# Patient Record
Sex: Male | Born: 1945 | Race: White | Hispanic: No | Marital: Married | Smoking: Former smoker
Health system: Southern US, Community
[De-identification: ages and names within clinical notes are randomized; demographics above are authoritative.]

## PROBLEM LIST (undated history)

## (undated) DIAGNOSIS — Z86711 Personal history of pulmonary embolism: Secondary | ICD-10-CM

## (undated) DIAGNOSIS — D61811 Other drug-induced pancytopenia: Secondary | ICD-10-CM

## (undated) DIAGNOSIS — R251 Tremor, unspecified: Secondary | ICD-10-CM

## (undated) DIAGNOSIS — D759 Disease of blood and blood-forming organs, unspecified: Secondary | ICD-10-CM

## (undated) DIAGNOSIS — F259 Schizoaffective disorder, unspecified: Secondary | ICD-10-CM

## (undated) DIAGNOSIS — D509 Iron deficiency anemia, unspecified: Secondary | ICD-10-CM

## (undated) DIAGNOSIS — F329 Major depressive disorder, single episode, unspecified: Secondary | ICD-10-CM

## (undated) DIAGNOSIS — Z8673 Personal history of transient ischemic attack (TIA), and cerebral infarction without residual deficits: Secondary | ICD-10-CM

## (undated) DIAGNOSIS — G20A1 Parkinson's disease without dyskinesia, without mention of fluctuations: Secondary | ICD-10-CM

## (undated) DIAGNOSIS — N401 Enlarged prostate with lower urinary tract symptoms: Secondary | ICD-10-CM

## (undated) DIAGNOSIS — I252 Old myocardial infarction: Secondary | ICD-10-CM

## (undated) DIAGNOSIS — K760 Fatty (change of) liver, not elsewhere classified: Secondary | ICD-10-CM

## (undated) DIAGNOSIS — K5090 Crohn's disease, unspecified, without complications: Secondary | ICD-10-CM

## (undated) DIAGNOSIS — N200 Calculus of kidney: Secondary | ICD-10-CM

## (undated) DIAGNOSIS — M1990 Unspecified osteoarthritis, unspecified site: Secondary | ICD-10-CM

## (undated) DIAGNOSIS — I2510 Atherosclerotic heart disease of native coronary artery without angina pectoris: Secondary | ICD-10-CM

## (undated) DIAGNOSIS — T50905A Adverse effect of unspecified drugs, medicaments and biological substances, initial encounter: Secondary | ICD-10-CM

## (undated) DIAGNOSIS — N138 Other obstructive and reflux uropathy: Secondary | ICD-10-CM

## (undated) DIAGNOSIS — L309 Dermatitis, unspecified: Secondary | ICD-10-CM

## (undated) DIAGNOSIS — D696 Thrombocytopenia, unspecified: Secondary | ICD-10-CM

## (undated) DIAGNOSIS — Z973 Presence of spectacles and contact lenses: Secondary | ICD-10-CM

## (undated) DIAGNOSIS — N183 Chronic kidney disease, stage 3 unspecified: Secondary | ICD-10-CM

## (undated) DIAGNOSIS — F32A Depression, unspecified: Secondary | ICD-10-CM

## (undated) DIAGNOSIS — F419 Anxiety disorder, unspecified: Secondary | ICD-10-CM

## (undated) DIAGNOSIS — G2581 Restless legs syndrome: Secondary | ICD-10-CM

## (undated) DIAGNOSIS — R339 Retention of urine, unspecified: Secondary | ICD-10-CM

## (undated) DIAGNOSIS — R269 Unspecified abnormalities of gait and mobility: Secondary | ICD-10-CM

## (undated) DIAGNOSIS — K219 Gastro-esophageal reflux disease without esophagitis: Secondary | ICD-10-CM

## (undated) DIAGNOSIS — Z932 Ileostomy status: Secondary | ICD-10-CM

## (undated) DIAGNOSIS — Z978 Presence of other specified devices: Secondary | ICD-10-CM

## (undated) DIAGNOSIS — G20 Parkinson's disease: Secondary | ICD-10-CM

## (undated) DIAGNOSIS — Z86718 Personal history of other venous thrombosis and embolism: Secondary | ICD-10-CM

## (undated) DIAGNOSIS — R7303 Prediabetes: Secondary | ICD-10-CM

## (undated) DIAGNOSIS — Z960 Presence of urogenital implants: Secondary | ICD-10-CM

## (undated) HISTORY — DX: Major depressive disorder, single episode, unspecified: F32.9

## (undated) HISTORY — PX: TONSILLECTOMY: SUR1361

## (undated) HISTORY — DX: Depression, unspecified: F32.A

## (undated) HISTORY — PX: COLECTOMY: SHX59

## (undated) HISTORY — PX: CARDIAC CATHETERIZATION: SHX172

## (undated) HISTORY — DX: Anxiety disorder, unspecified: F41.9

## (undated) HISTORY — PX: TRANSTHORACIC ECHOCARDIOGRAM: SHX275

## (undated) HISTORY — DX: Crohn's disease, unspecified, without complications: K50.90

---

## 1999-03-26 DIAGNOSIS — I252 Old myocardial infarction: Secondary | ICD-10-CM

## 1999-03-26 HISTORY — DX: Old myocardial infarction: I25.2

## 2001-03-25 HISTORY — PX: ABDOMINOPERINEAL PROCTOCOLECTOMY: SUR8

## 2012-12-04 ENCOUNTER — Emergency Department (HOSPITAL_COMMUNITY)
Admission: EM | Admit: 2012-12-04 | Discharge: 2012-12-04 | Disposition: A | Discharge status: Home / Self Care | Payer: Medicare Other | Attending: Emergency Medicine | Admitting: Emergency Medicine

## 2012-12-04 ENCOUNTER — Encounter (HOSPITAL_COMMUNITY): Payer: Self-pay | Admitting: Emergency Medicine

## 2012-12-04 DIAGNOSIS — L259 Unspecified contact dermatitis, unspecified cause: Secondary | ICD-10-CM | POA: Insufficient documentation

## 2012-12-04 DIAGNOSIS — L309 Dermatitis, unspecified: Secondary | ICD-10-CM

## 2012-12-04 DIAGNOSIS — R21 Rash and other nonspecific skin eruption: Secondary | ICD-10-CM | POA: Insufficient documentation

## 2012-12-04 DIAGNOSIS — Z8719 Personal history of other diseases of the digestive system: Secondary | ICD-10-CM | POA: Insufficient documentation

## 2012-12-04 DIAGNOSIS — Z933 Colostomy status: Secondary | ICD-10-CM | POA: Insufficient documentation

## 2012-12-04 DIAGNOSIS — Z9889 Other specified postprocedural states: Secondary | ICD-10-CM | POA: Insufficient documentation

## 2012-12-04 DIAGNOSIS — Z79899 Other long term (current) drug therapy: Secondary | ICD-10-CM | POA: Insufficient documentation

## 2012-12-04 DIAGNOSIS — R197 Diarrhea, unspecified: Secondary | ICD-10-CM | POA: Insufficient documentation

## 2012-12-04 MED ORDER — METRONIDAZOLE 500 MG PO TABS: 500.0000 mg | ORAL_TABLET | Freq: Two times a day (BID) | ORAL | Status: DC

## 2012-12-04 MED ORDER — DIPHENOXYLATE-ATROPINE 2.5-0.025 MG PO TABS: 1.0000 | ORAL_TABLET | Freq: Four times a day (QID) | ORAL | Status: DC | PRN

## 2012-12-04 MED ORDER — MENTHOL-ZINC OXIDE 0.44-20.625 % EX OINT: 1.0000 "application " | TOPICAL_OINTMENT | Freq: Four times a day (QID) | CUTANEOUS | Status: DC

## 2012-12-04 NOTE — ED Notes (Signed)
Pt sees DR. Eusebio Friendly for GI management and asked that stool culture results be sent to him if they come back abnormal. His number is [phone]

## 2012-12-04 NOTE — ED Provider Notes (Signed)
CSN: [id number]     Arrival date & time 12/04/12  1630 History   First MD Initiated Contact with Patient 12/04/12 1809     Chief Complaint  Patient presents with  . Diarrhea    colostomy  . 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 Done for Crohn's colitis. He is a short segment of bowel and colostomy. This is not an ileostomy. He had a slight increase in the amount of stool output and a slight decrease in the consistency of the stool. He developed an area of irritated skin adjacent his ostomy. He's had difficulty getting the flu with his adhesive for his ostomy. He has had recurrent leaks. His skin is irritated. He became frustrated at home. He presents here. He's had no blood in the stools presenting abdominal pain or cramping History reviewed. No pertinent past medical history. Past Surgical History  Procedure Laterality Date  . Abdominal surgery    . Ostomy     History reviewed. No pertinent family history. History  Substance Use Topics  . Smoking status: Never Smoker   . Smokeless tobacco: Not on file  . Alcohol Use: No    Review of Systems  Constitutional: Negative for fever.  Respiratory: Negative for shortness of breath.   Cardiovascular: Negative for chest pain.  Gastrointestinal: Positive for diarrhea. Negative for abdominal pain.  Genitourinary: Negative for urgency and decreased urine volume.  Skin: Positive for rash.  Neurological: Negative for weakness.  Hematological: Does not bruise/bleed easily.    Allergies  Aspirin and Celecoxib  Home Medications   Current Outpatient Rx  Name  Route  Sig  Dispense  Refill  . adalimumab (HUMIRA PEN) 40 MG/0.8ML injection   Subcutaneous   Inject 40 mg into the skin every 14 (fourteen) days. Due Monday     12/07/12         . calcium carbonate (OS-CAL) 600 MG TABS tablet   Oral   Take 600 mg by mouth 2 (two) times daily with a meal.         . Multiple Vitamin  (MULTIVITAMIN WITH MINERALS) TABS tablet   Oral   Take 1 tablet by mouth daily.         . diphenoxylate-atropine (LOMOTIL) 2.5-0.025 MG per tablet   Oral   Take 1 tablet by mouth 4 (four) times daily as needed for diarrhea or loose stools.   30 tablet   0   . Menthol-Zinc Oxide (CALMOSEPTINE) 0.44-20.625 % OINT   Apply externally   Apply 1 application topically QID.   120 g   2   . metroNIDAZOLE (FLAGYL) 500 MG tablet   Oral   Take 1 tablet (500 mg total) by mouth 2 (two) times daily.   20 tablet   0    BP 126/78  Pulse 82  Temp(Src) 98.5 F (36.9 C) (Oral)  Resp 20  SpO2 96% Physical Exam  Constitutional: He is oriented to person, place, and time. He appears well-developed and well-nourished. No distress.  HENT:  Head: Normocephalic.  Eyes: Conjunctivae are normal. Pupils are equal, round, and reactive to light. No scleral icterus.  Neck: Normal range of motion. Neck supple. No thyromegaly present.  Cardiovascular: Normal rate and regular rhythm.  Exam reveals no gallop and no friction rub.   No murmur heard. Pulmonary/Chest: Effort normal and breath sounds normal. No respiratory distress. He has no wheezes.  He has no rales.  Abdominal: Soft. Bowel sounds are normal. He exhibits no distension. There is no tenderness. There is no rebound.  Musculoskeletal: Normal range of motion.  Neurological: He is alert and oriented to person, place, and time.  Skin: Skin is warm and dry. Rash noted.  The skin adjacent his ostomy site is somewhat erythematous. There is some breakdown of skin and some friable skin that bleeds easily.  Psychiatric: He has a normal mood and affect. His behavior is normal.    ED Course  Procedures (including critical care time) Labs Review Labs Reviewed  OCCULT BLOOD, POC DEVICE - Abnormal; Notable for the following:    Fecal Occult Bld POSITIVE (*)    All other components within normal limits  STOOL CULTURE  CLOSTRIDIUM DIFFICILE BY PCR   OCCULT BLOOD X 1 CARD TO LAB, STOOL   Imaging Review No results found.  MDM   1. Dermatitis   2. Diarrhea    Evaluation of symptoms after 6:00 at night on Friday. We do not have an ostomy nurse available. I had him brought his eyes or his ostomy. We've discussed having the ring larger. Will use them, septic joint and adjacent the ostomy. He has an extra wide he said that he can place over the initial area of a single Fleet c overuse of breakthrough leakage. Obtained a stool for culture, enteric pathogens, and C. difficile. Place him on empiric Flagyl. Lomotil to hopefully decrease volume., Ceftin ointment. See your doctor Monday for further instructions.    Lolita Patella, MD 12/04/12 1940

## 2012-12-04 NOTE — ED Notes (Signed)
Pt states that he is having constant diarrhea through his colostomy and having to change his bags constantly due to leakage instead of every 3-4 days like normal.  Pt states that the skin under his stoma is very raw and irritated and is why the pt wants to be evaluated today.

## 2012-12-08 LAB — STOOL CULTURE

## 2014-09-28 ENCOUNTER — Encounter: Payer: Self-pay | Admitting: Neurology

## 2014-09-28 ENCOUNTER — Ambulatory Visit (INDEPENDENT_AMBULATORY_CARE_PROVIDER_SITE_OTHER): Payer: Medicare Other | Admitting: Neurology

## 2014-09-28 VITALS — BP 138/82 | HR 99 | Temp 98.4°F | Ht 66.0 in | Wt 214.4 lb

## 2014-09-28 DIAGNOSIS — R270 Ataxia, unspecified: Secondary | ICD-10-CM | POA: Diagnosis not present

## 2014-09-28 DIAGNOSIS — G458 Other transient cerebral ischemic attacks and related syndromes: Secondary | ICD-10-CM | POA: Diagnosis not present

## 2014-09-28 DIAGNOSIS — G0481 Other encephalitis and encephalomyelitis: Secondary | ICD-10-CM | POA: Diagnosis not present

## 2014-09-28 DIAGNOSIS — F05 Delirium due to known physiological condition: Secondary | ICD-10-CM | POA: Diagnosis not present

## 2014-09-28 DIAGNOSIS — R251 Tremor, unspecified: Secondary | ICD-10-CM | POA: Diagnosis not present

## 2014-09-28 DIAGNOSIS — G459 Transient cerebral ischemic attack, unspecified: Secondary | ICD-10-CM | POA: Insufficient documentation

## 2014-09-28 DIAGNOSIS — R413 Other amnesia: Secondary | ICD-10-CM

## 2014-09-28 DIAGNOSIS — E538 Deficiency of other specified B group vitamins: Secondary | ICD-10-CM

## 2014-09-28 DIAGNOSIS — R410 Disorientation, unspecified: Secondary | ICD-10-CM | POA: Insufficient documentation

## 2014-09-28 NOTE — Progress Notes (Addendum)
[facility]    Provider:  Dr Jaynee Eagles Referring Provider: Katherina Mires, MD Primary Care Physician:  Suzanna Obey, MD  CC:  tremor  HPI:  Luke Bennett is a 69 y.o. male here as a referral from Dr. Doreene Nest for tremor. He has a PMHx of Schizoaffective disorder on Seroquel, depression, anxiety, TIA, MI, Crohn's Dz, thrombocytopenia, CKD, glucose intolerance. He is here with his wife who provides information.   He has a worsening tremor, started a few months ago. Also noticing that his right shoulder is lower than the other and holds his hand on his stomach when he walks possibly to try and keep it from trembling. He is having a harder time remembering things. Memory is less than they used to be. Walking is slower but steady.  He gets tired easily. No falls. He has had several episdes at night with feelings like he was going to collapse. He is changing a lot, declining. He will forget things in 15 minutes, wife will tell him something and he forgets, has to be reminded. He forgets things he does every day, like setting the table - forgets things that go on the table. He is getitng easily confused by tasks. Wife pays the bills. He does not drive. He is on Humira for Crohn's disease. They notice the tremor all the time. Mostly with resting or walking, sitting or eating, less with use. Doesn't happen when sleeping. Smell is fine. No constipation. No problems swallowing. Handwriting is getting poorer, shaky and smaller. No other focal neurologic symptoms. Denies hypophonia, shuffling. No other focal neurologic deficits.   Reviewed notes, labs and imaging from outside physicians, which showed: Notes from Dr. Suzanna Obey also document that patient's tremor has been worsening, right hand only, at rest. Does not affect ADLs. No gait instability. She was also concern for idiopathic Parkinson's disease versus TIA or stroke. He is on Humira for Crohn's disease. She also noted right greater than  left cogwheeling on exam.  Review of Systems: Patient complains of symptoms per HPI as well as the following symptoms: Fatigue, cough, snoring, skin sensitivity, impotence, memory loss, confusion, weakness, insomnia, snoring, depression, anxiety, too much sleep, decreased energy. Pertinent negatives per HPI. All others negative.   History   Social History  . Marital Status: Married    Spouse Name: N/A  . Number of Children: N/A  . Years of Education: N/A   Occupational History  . Retired    Social History Main Topics  . Smoking status: Former Research scientist (life sciences)  . Smokeless tobacco: Not on file  . Alcohol Use: No  . Drug Use: No  . Sexual Activity: Not on file   Other Topics Concern  . Not on file   Social History Narrative   Lives at home with wife and in-laws.   Caffeine use: none       Family History  Problem Relation Age of Onset  . Hypertension Sister   . Hodgkin's lymphoma Sister   . Cerebral aneurysm Father     Past Medical History  Diagnosis Date  . Depression   . Anxiety   . Heart attack   . TIA (transient ischemic attack)   . Crohn's disease   . Fatty liver     Past Surgical History  Procedure Laterality Date  . Abdominal surgery    . Ostomy    . Tonsillectomy    . Colectomy    . Stoma dilatation      Current Outpatient Prescriptions  Medication Sig Dispense Refill  . adalimumab (HUMIRA PEN) 40 MG/0.8ML injection Inject 40 mg into the skin every 14 (fourteen) days. Due Monday     12/07/12    . calcium carbonate (OS-CAL) 600 MG TABS tablet Take 600 mg by mouth 2 (two) times daily with a meal.    . Menthol-Zinc Oxide (CALMOSEPTINE) 0.44-20.625 % OINT Apply 1 application topically QID. 120 g 2  . Multiple Vitamin (MULTIVITAMIN WITH MINERALS) TABS tablet Take 1 tablet by mouth daily.    . QUEtiapine (SEROQUEL) 300 MG tablet Take 300 mg by mouth daily.  4   No current facility-administered medications for this visit.    Allergies as of 09/28/2014 - Review  Complete 09/28/2014  Allergen Reaction Noted  . Aspirin  12/04/2012  . Celecoxib  12/04/2012    Vitals: BP 138/82 mmHg  Pulse 99  Temp(Src) 98.4 F (36.9 C) (Oral)  Ht 5' 6"  (1.676 m)  Wt 214 lb 6.4 oz (97.251 kg)  BMI 34.62 kg/m2 Last Weight:  Wt Readings from Last 1 Encounters:  09/28/14 214 lb 6.4 oz (97.251 kg)   Last Height:   Ht Readings from Last 1 Encounters:  09/28/14 5' 6"  (1.676 m)    Physical exam: Exam: Gen: NAD, conversant, well nourised, obese, well groomed                     CV: RRR, no MRG. No Carotid Bruits. No peripheral edema, warm, nontender Eyes: Conjunctivae clear without exudates or hemorrhage  Neuro: Detailed Neurologic Exam  Speech:    Speech is normal; fluent and spontaneous with normal comprehension.  Cognition:    The patient is oriented to person, place, and time;     recent and remote memory appear impaired;     language fluent;     Impaired attention, concentration,     fund of knowledge impaired Cranial Nerves: masked facies    The pupils are equal, round, and reactive to light. The fundi are flat.  Visual fields are full to finger confrontation. Impaired upgaze, otherwise extraocular movements are intact. Trigeminal sensation is intact and the muscles of mastication are normal. The face is symmetric. The palate elevates in the midline. Hearing intact. Voice is normal. Shoulder shrug is normal. The tongue has normal motion without fasciculations.   Coordination:    Right arm bradykinesias  Gait:    Decreased arm right swing and re-emergent right hand tremor.   Motor Observation:    Mild right resting tremor with some postural components Tone:   Cogwheeling upper right extremity with facilitation  Posture:    Slightly stooped.    Strength:    Strength is V/V in the upper and lower limbs.      Sensation: intact to LT     Reflex Exam:  DTR's:    Deep tendon reflexes in the upper and lower extremities are symmetrical  bilaterally.   Toes:    The toes are downgoing bilaterally.   Clonus:    Clonus is absent.   Assessment/Plan:   69 y.o. male here as a referral from Dr. Doreene Nest for tremor. He has a PMHx of Schizoaffective disorder on Seroquel, depression, anxiety, TIA, MI, Crohn's Dz, thrombocytopenia, CKD, glucose intolerance who is here for eval of tremor and memory changes. Neuro exam significant for resting tremor with postural components, right arm cogwheeling with facilitation, decreased right arm swing on walking with  emergent tremor, decreased blink reflex, right hand bradykinesia. Consistent with parkinsonism. Patient is  on dopamine blocking agents, unclear if this is secondary to medication effect or if he is developing idiopathic PD, other neurodegenerative disorder. Could also be caused by cerebrovascular disease, brain lesions and other causes. We'll order an MRI of the brain and a serum workup.   Addendum: MRI of the brain normal for age .  Sarina Ill, MD  [facility] [address] [place], [place] [address]  Phone [phone] Fax [phone] .

## 2014-09-28 NOTE — Patient Instructions (Signed)
Overall you are doing fairly well but I do want to suggest a few things today:   Remember to drink plenty of fluid, eat healthy meals and do not skip any meals. Try to eat protein with a every meal and eat a healthy snack such as fruit or nuts in between meals. Try to keep a regular sleep-wake schedule and try to exercise daily, particularly in the form of walking, 20-30 minutes a day, if you can.   As far as your medications are concerned, I would like to suggest; hold off on medications for now until workup complete  As far as diagnostic testing: MRi of the brain, labs  I would like to see you back in 3 months, sooner if we need to. Please call us with any interim questions, concerns, problems, updates or refill requests.   Please also call us for any test results so we can go over those with you on the phone.  My clinical assistant and will answer any of your questions and relay your messages to me and also relay most of my messages to you.   Our phone number is [phone]. We also have an after hours call service for urgent matters and there is a physician on-call for urgent questions. For any emergencies you know to call 911 or go to the nearest emergency room

## 2014-10-02 LAB — THYROID PANEL WITH TSH
Free Thyroxine Index: 1.3 (ref 1.2–4.9)
T3 Uptake Ratio: 27 % (ref 24–39)
T4 TOTAL: 4.9 ug/dL (ref 4.5–12.0)
TSH: 1.24 u[IU]/mL (ref 0.450–4.500)

## 2014-10-02 LAB — COMPREHENSIVE METABOLIC PANEL
ALBUMIN: 4 g/dL (ref 3.6–4.8)
ALK PHOS: 60 IU/L (ref 39–117)
ALT: 35 IU/L (ref 0–44)
AST: 38 IU/L (ref 0–40)
Albumin/Globulin Ratio: 1.2 (ref 1.1–2.5)
BILIRUBIN TOTAL: 0.9 mg/dL (ref 0.0–1.2)
BUN / CREAT RATIO: 12 (ref 10–22)
BUN: 16 mg/dL (ref 8–27)
CHLORIDE: 99 mmol/L (ref 97–108)
CO2: 22 mmol/L (ref 18–29)
Calcium: 9.6 mg/dL (ref 8.6–10.2)
Creatinine, Ser: 1.36 mg/dL — ABNORMAL HIGH (ref 0.76–1.27)
GFR calc non Af Amer: 53 mL/min/{1.73_m2} — ABNORMAL LOW (ref >59)
GFR, EST AFRICAN AMERICAN: 61 mL/min/{1.73_m2} (ref >59)
GLUCOSE: 97 mg/dL (ref 65–99)
Globulin, Total: 3.3 g/dL (ref 1.5–4.5)
POTASSIUM: 4.7 mmol/L (ref 3.5–5.2)
Sodium: 135 mmol/L (ref 134–144)
Total Protein: 7.3 g/dL (ref 6.0–8.5)

## 2014-10-02 LAB — METHYLMALONIC ACID, SERUM: METHYLMALONIC ACID: 379 nmol/L — AB (ref 0–378)

## 2014-10-02 LAB — B12 AND FOLATE PANEL
Folate: >20 ng/mL (ref >3.0)
VITAMIN B 12: 546 pg/mL (ref 211–946)

## 2014-10-03 ENCOUNTER — Telehealth: Payer: Self-pay | Admitting: *Deleted

## 2014-10-03 NOTE — Telephone Encounter (Signed)
Spoke with pt about normal B12 and TSH. Told him his creatinine was elevated at 1.36 (normal: .76-1.27) and he needed to f/u with his PCP about this. He also asked what his potassium was. I informed him it was 4.7. Told him to call back with further questions and we are open until 5pm. Pt verbalized understanding.

## 2014-10-06 ENCOUNTER — Ambulatory Visit
Admission: RE | Admit: 2014-10-06 | Discharge: 2014-10-06 | Disposition: A | Discharge status: Home / Self Care | Payer: Medicare Other | Source: Ambulatory Visit | Attending: Neurology | Admitting: Neurology

## 2014-10-06 DIAGNOSIS — R413 Other amnesia: Secondary | ICD-10-CM | POA: Diagnosis not present

## 2014-10-06 DIAGNOSIS — R410 Disorientation, unspecified: Secondary | ICD-10-CM

## 2014-10-06 DIAGNOSIS — E538 Deficiency of other specified B group vitamins: Secondary | ICD-10-CM

## 2014-10-06 DIAGNOSIS — R251 Tremor, unspecified: Secondary | ICD-10-CM | POA: Diagnosis not present

## 2014-10-06 DIAGNOSIS — G0481 Other encephalitis and encephalomyelitis: Secondary | ICD-10-CM

## 2014-10-06 DIAGNOSIS — R270 Ataxia, unspecified: Secondary | ICD-10-CM | POA: Diagnosis not present

## 2014-10-06 DIAGNOSIS — G458 Other transient cerebral ischemic attacks and related syndromes: Secondary | ICD-10-CM

## 2014-10-06 DIAGNOSIS — F05 Delirium due to known physiological condition: Secondary | ICD-10-CM

## 2014-10-06 MED ORDER — GADOBENATE DIMEGLUMINE 529 MG/ML IV SOLN
20.0000 mL | Freq: Once | INTRAVENOUS | Status: AC | PRN
  Administered 2014-10-06: 20 mL via INTRAVENOUS

## 2014-10-11 ENCOUNTER — Telehealth: Payer: Self-pay | Admitting: Neurology

## 2014-10-11 NOTE — Telephone Encounter (Signed)
Spoke to patient. MRI of the brain normal for age.

## 2014-10-12 ENCOUNTER — Encounter: Payer: Self-pay | Admitting: *Deleted

## 2014-12-29 ENCOUNTER — Ambulatory Visit (INDEPENDENT_AMBULATORY_CARE_PROVIDER_SITE_OTHER): Payer: Medicare Other | Admitting: Neurology

## 2014-12-29 ENCOUNTER — Encounter: Payer: Self-pay | Admitting: Neurology

## 2014-12-29 VITALS — BP 136/82 | HR 85 | Ht 66.0 in | Wt 213.2 lb

## 2014-12-29 DIAGNOSIS — G20 Parkinson's disease: Secondary | ICD-10-CM | POA: Diagnosis not present

## 2014-12-29 MED ORDER — CARBIDOPA-LEVODOPA 25-100 MG PO TABS: 0.5000 | ORAL_TABLET | Freq: Three times a day (TID) | ORAL | Status: DC

## 2014-12-29 NOTE — Patient Instructions (Signed)
Overall you are doing fairly well but I do want to suggest a few things today:   Remember to drink plenty of fluid, eat healthy meals and do not skip any meals. Try to eat protein with a every meal and eat a healthy snack such as fruit or nuts in between meals. Try to keep a regular sleep-wake schedule and try to exercise daily, particularly in the form of walking, 20-30 minutes a day, if you can.   As far as your medications are concerned, I would like to suggest: Carbidopa/Levodopa 1/2 pill 3x a day, 4 hours apart, for example 8am, noon and 4pm. Try to separate Sinemet from food (especially protein-rich foods like meat, dairy, eggs) by about 30-60 mins - this will help the absorption of the medication. If you have some nausea with the medication, you can take it with some light food like crackers or ginger ale  I would like to see you back in 3 months, sooner if we need to. Please call us with any interim questions, concerns, problems, updates or refill requests.   Our phone number is [phone]. We also have an after hours call service for urgent matters and there is a physician on-call for urgent questions. For any emergencies you know to call 911 or go to the nearest emergency room

## 2014-12-29 NOTE — Progress Notes (Signed)
[facility]    Provider: Dr Jaynee Eagles Referring Provider: Katherina Mires, MD Primary Care Physician: Suzanna Obey, MD  CC: parkinsonism  HPI: Luke Bennett is a 69 y.o. male here as a referral from Dr. Doreene Nest for tremor. He has a PMHx of Schizoaffective disorder on Seroquel, depression, anxiety, TIA, MI, Crohn's Dz, thrombocytopenia, CKD, glucose intolerance. He is here with his wife who provides information. He acts out his dreams, she almost got clobbered the other night, hit her pillow. His resting tremor continues, feels it is worsening. Discussed MRI of the brain which was normal. Discussed his previous history of dopamine-blocking agents and his parkinsonism. Can try Sinemet and see how he responds, warned that it can cause confusion, hallucinations which is worrisome given patient's past medical history. He is on Seroquel currently. I have asked him to follow with psychiatry as well.   Previous history: He has a worsening tremor, started a few months ago. Also noticing that his right shoulder is lower than the other and holds his hand on his stomach when he walks possibly to try and keep it from trembling. He is having a harder time remembering things. Memory is less than they used to be. Walking is slower but steady. He gets tired easily. No falls. He has had several episdes at night with feelings like he was going to collapse. He is changing a lot, declining. He will forget things in 15 minutes, wife will tell him something and he forgets, has to be reminded. He forgets things he does every day, like setting the table - forgets things that go on the table. He is getitng easily confused by tasks. Wife pays the bills. He does not drive. He is on Humira for Crohn's disease. They notice the tremor all the time. Mostly with resting or walking, sitting or eating, less with use. Doesn't happen when sleeping. Smell is fine. No constipation. No problems swallowing. Handwriting is  getting poorer, shaky and smaller. No other focal neurologic symptoms. Denies hypophonia, shuffling. No other focal neurologic deficits.   Reviewed notes, labs and imaging from outside physicians, which showed: Notes from Dr. Suzanna Obey also document that patient's tremor has been worsening, right hand only, at rest. Does not affect ADLs. No gait instability. She was also concern for idiopathic Parkinson's disease versus TIA or stroke. He is on Humira for Crohn's disease. She also noted right greater than left cogwheeling on exam.  Review of Systems: Patient complains of symptoms per HPI as well as the following symptoms: tremor. No CP, no SOB. Pertinent negatives per HPI. All others negative.   Social History   Social History  . Marital Status: Married    Spouse Name: N/A  . Number of Children: N/A  . Years of Education: N/A   Occupational History  . Retired    Social History Main Topics  . Smoking status: Former Research scientist (life sciences)  . Smokeless tobacco: Not on file  . Alcohol Use: No  . Drug Use: No  . Sexual Activity: Not on file   Other Topics Concern  . Not on file   Social History Narrative   Lives at home with wife and in-laws.   Caffeine use: none       Family History  Problem Relation Age of Onset  . Hypertension Sister   . Hodgkin's lymphoma Sister   . Cerebral aneurysm Father   . Parkinsonism Neg Hx     Past Medical History  Diagnosis Date  . Depression   .  Anxiety   . Heart attack ([place])   . TIA (transient ischemic attack)   . Crohn's disease ([place])   . Fatty liver     Past Surgical History  Procedure Laterality Date  . Abdominal surgery    . Ostomy    . Tonsillectomy    . Colectomy    . Stoma dilatation      Current Outpatient Prescriptions  Medication Sig Dispense Refill  . adalimumab (HUMIRA PEN) 40 MG/0.8ML injection Inject 40 mg into the skin every 14 (fourteen) days. Due Monday     12/07/12    . calcium carbonate (OS-CAL) 600 MG TABS tablet Take 600  mg by mouth 2 (two) times daily with a meal.    . Menthol-Zinc Oxide (CALMOSEPTINE) 0.44-20.625 % OINT Apply 1 application topically QID. 120 g 2  . Multiple Vitamin (MULTIVITAMIN WITH MINERALS) TABS tablet Take 1 tablet by mouth daily.    . QUEtiapine (SEROQUEL) 300 MG tablet Take 300 mg by mouth daily.  4   No current facility-administered medications for this visit.    Allergies as of 12/29/2014 - Review Complete 12/29/2014  Allergen Reaction Noted  . Aspirin  12/04/2012  . Celecoxib  12/04/2012    Vitals: BP 136/82 mmHg  Pulse 85  Ht 5' 6"  (1.676 m)  Wt 213 lb 3.2 oz (96.707 kg)  BMI 34.43 kg/m2 Last Weight:  Wt Readings from Last 1 Encounters:  12/29/14 213 lb 3.2 oz (96.707 kg)   Last Height:   Ht Readings from Last 1 Encounters:  12/29/14 5' 6"  (1.676 m)    Physical exam: Exam: Gen: NAD, conversant, well nourised, obese, well groomed  CV: RRR, no MRG. No Carotid Bruits. No peripheral edema, warm, nontender Eyes: Conjunctivae clear without exudates or hemorrhage  Neuro: Detailed Neurologic Exam  Speech:  Speech is normal; fluent and spontaneous with normal comprehension.  Cognition:  The patient is oriented to person, place, and time;   Cranial Nerves:  Hypomimia  The pupils are equal, round, and reactive to light. The fundi are flat. Visual fields are full to finger confrontation. Impaired upgaze, otherwise extraocular movements are intact. Trigeminal sensation is intact and the muscles of mastication are normal. The face is symmetric. The palate elevates in the midline. Hearing intact. Voice is normal. Shoulder shrug is normal. The tongue has normal motion without fasciculations.   Coordination:  Right arm bradykinesia compared to left  Gait:  Decreased arm right swing and re-emergent right hand tremor.   Motor Observation:  Mild right resting tremor with some postural components Tone:  Cogwheeling upper right  extremity with facilitation  Posture:  Slightly stooped.   Strength:  Strength is V/V in the upper and lower limbs.    Sensation: intact to LT   Reflex Exam:   Assessment/Plan: 69 y.o. male here as a referral from Dr. Doreene Nest for resting tremor. He has a PMHx of Schizoaffective disorder on Seroquel, depression, anxiety, TIA, MI, Crohn's Dz, thrombocytopenia, CKD, glucose intolerance who is here for eval of tremor and memory changes. Neuro exam significant for resting tremor with postural components, right arm cogwheeling with facilitation, decreased right arm swing on walking with emergent tremor, decreased blink reflex, right hand bradykinesia. Consistent with parkinsonism. Patient is on dopamine blocking agent, unclear if this is secondary to medication effect or if he is developing idiopathic PD, other neurodegenerative disorder. However seroquel is the least likely of the atypical antidepressants to cause extrapyramidal side effects. MRi of the brain normal.  Would  like to try Sinemet. Will start Sinemet low dose 1/2 tab tid. -Try to separate Sinemet from food (especially protein-rich foods like meat, dairy, eggs) by about 30-60 mins - this will help the absorption of the medication. If you have some nausea with the medication, you can take it with some light food like crackers or ginger ale. He has follow up with psychiatry.   Discussed common side effects of Sinemet include:  dizziness,  drowsiness,  blurred vision,  nausea,  vomiting,  dry mouth,  loss of appetite,  heartburn,  diarrhea,  constipation,  sneezing,  stuffy nose,  cough,  other cold symptoms,  muscle pain,  numbness or tingly feeling,  trouble sleeping (insomnia or strange dreams),  skin rash,  itching,  and headache. Unlikely but serious side effects including:  greatly increased eye blinking/twitching,  fainting,  mental/mood changes (e.g., confusion, depression, hallucinations, thoughts  of suicide),  unusual strong urges (such as increased gambling, increased sexual urges),  or worsening of involuntary movements/spasms.   Sarina Ill, MD  [facility] [address] [place], [place] [address]  Phone [phone] Fax [phone]  A total of 30 minutes was spent face-to-face with this patient. Over half this time was spent on counseling patient on the parkinsonism diagnosis and different diagnostic and therapeutic options available.

## 2015-01-03 ENCOUNTER — Encounter: Payer: Self-pay | Admitting: Neurology

## 2015-01-03 ENCOUNTER — Ambulatory Visit (INDEPENDENT_AMBULATORY_CARE_PROVIDER_SITE_OTHER): Payer: Medicare Other | Admitting: Neurology

## 2015-01-03 VITALS — BP 124/78 | HR 84 | Resp 20 | Ht 66.0 in | Wt 214.0 lb

## 2015-01-03 DIAGNOSIS — R0683 Snoring: Secondary | ICD-10-CM

## 2015-01-03 DIAGNOSIS — G4730 Sleep apnea, unspecified: Secondary | ICD-10-CM

## 2015-01-03 DIAGNOSIS — G4752 REM sleep behavior disorder: Secondary | ICD-10-CM | POA: Diagnosis not present

## 2015-01-03 DIAGNOSIS — G4710 Hypersomnia, unspecified: Secondary | ICD-10-CM

## 2015-01-03 DIAGNOSIS — F258 Other schizoaffective disorders: Secondary | ICD-10-CM

## 2015-01-03 DIAGNOSIS — R5382 Chronic fatigue, unspecified: Secondary | ICD-10-CM

## 2015-01-03 DIAGNOSIS — K50118 Crohn's disease of large intestine with other complication: Secondary | ICD-10-CM

## 2015-01-03 NOTE — Patient Instructions (Signed)
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
## 2015-01-09 ENCOUNTER — Telehealth: Payer: Self-pay | Admitting: Neurology

## 2015-01-09 NOTE — Telephone Encounter (Signed)
Patient is calling and states that his Rx carbidopa-levodopa 25-100 mg is causing a rash on his body.  Please call.

## 2015-01-09 NOTE — Telephone Encounter (Signed)
Called pt back. Advised per Dr. Jaynee Eagles to stop medication and see if the rash improves. Call us back to let us know if it improves or worsens. He verbalized understanding.

## 2015-01-09 NOTE — Telephone Encounter (Signed)
I would stop the medication and see if the rash improves. thanks

## 2015-01-09 NOTE — Telephone Encounter (Signed)
Called pt back. He stated he has a rash located on both arms and hands in 3 or 4 different places. States they do not itch too much, starting to itch a little now and is concerned. Has not started any other new medications. Has been taking every 4 hours as prescribed. He has separated from protein. Has been taking for 10 days. Advised to stop medication and told him I will speak with Dr. Jaynee Eagles and call him back. He verbalized understanding.

## 2015-01-18 NOTE — Telephone Encounter (Signed)
Patient called to advise rash has gone away and wants to know if he can go back on the medication. Please call [phone].

## 2015-01-19 ENCOUNTER — Telehealth: Payer: Self-pay | Admitting: Neurology

## 2015-01-19 ENCOUNTER — Other Ambulatory Visit: Payer: Self-pay | Admitting: Neurology

## 2015-01-19 DIAGNOSIS — G20 Parkinson's disease: Secondary | ICD-10-CM

## 2015-01-19 NOTE — Telephone Encounter (Signed)
Luke Bennett - I spoke to patient and his wife. I think we need a DaT Scan to differentiate between drug induced parkinsonism and parkinson's disease. Unfortunately we need to refer to [facility]. This is a Nuclear Medicine test so you need to do it but cassandra has ordered this in the past for me and can help. Can you take careof it please?

## 2015-01-20 NOTE — Telephone Encounter (Signed)
I have spoken with pt. insurance--[employer], spoke with Pearla--no pa required for DaT scan.  I also spoke with customer service at [facility] to confirm t his--they state no pa required as long as DaT scan is done at an in network facility. I confirmed [employer] is in network/fim

## 2015-01-20 NOTE — Telephone Encounter (Signed)
Faxed order for DaT scan to [facility] Department at [phone]. Included order, last office note, demographic sheet, and insurance. Noted that we contacted pt insurance company and no prior-auth is needed for test. Received fax confirmation.

## 2015-01-25 ENCOUNTER — Ambulatory Visit (INDEPENDENT_AMBULATORY_CARE_PROVIDER_SITE_OTHER): Payer: Medicare Other | Admitting: Neurology

## 2015-01-25 DIAGNOSIS — G4730 Sleep apnea, unspecified: Secondary | ICD-10-CM

## 2015-01-25 DIAGNOSIS — R0683 Snoring: Secondary | ICD-10-CM

## 2015-01-25 DIAGNOSIS — F258 Other schizoaffective disorders: Secondary | ICD-10-CM

## 2015-01-25 DIAGNOSIS — K50118 Crohn's disease of large intestine with other complication: Secondary | ICD-10-CM

## 2015-01-25 DIAGNOSIS — G4710 Hypersomnia, unspecified: Secondary | ICD-10-CM

## 2015-01-25 DIAGNOSIS — G4752 REM sleep behavior disorder: Secondary | ICD-10-CM

## 2015-01-26 ENCOUNTER — Other Ambulatory Visit: Payer: Self-pay | Admitting: *Deleted

## 2015-01-26 ENCOUNTER — Encounter: Payer: Self-pay | Admitting: *Deleted

## 2015-01-26 DIAGNOSIS — G20 Parkinson's disease: Secondary | ICD-10-CM

## 2015-01-26 NOTE — Progress Notes (Signed)
Faxed order for DaTscan to [facility] as requested. Faxed to [phone]. Received fax confirmation.

## 2015-01-26 NOTE — Sleep Study (Signed)
Please see the scanned sleep study interpretation located in the procedure tab within the chart review section.   

## 2015-02-06 ENCOUNTER — Telehealth: Payer: Self-pay

## 2015-02-06 NOTE — Telephone Encounter (Signed)
Spoke to pt and advised him that his sleep study revealed insomnia and PLMS. I advised pt to lose weight, diet, and exercise and to avoid driving or operating hazardous machinery when sleepy. I advised pt that Dr. Brett Fairy recommended coming in for an appt to discuss PLMs treatment. Appt made with pt for 12/7 at 3:30. Pt verbalized understanding to arrive at 3:15.

## 2015-03-01 ENCOUNTER — Encounter: Payer: Self-pay | Admitting: Neurology

## 2015-03-01 ENCOUNTER — Ambulatory Visit (INDEPENDENT_AMBULATORY_CARE_PROVIDER_SITE_OTHER): Payer: Medicare Other | Admitting: Neurology

## 2015-03-01 VITALS — BP 120/70 | HR 88 | Resp 20 | Ht 66.0 in | Wt 213.0 lb

## 2015-03-01 DIAGNOSIS — G2581 Restless legs syndrome: Secondary | ICD-10-CM | POA: Diagnosis not present

## 2015-03-01 DIAGNOSIS — G4761 Periodic limb movement disorder: Secondary | ICD-10-CM | POA: Diagnosis not present

## 2015-03-01 MED ORDER — ROPINIROLE HCL 0.25 MG PO TABS: 0.2500 mg | ORAL_TABLET | Freq: Every day | ORAL | Status: DC
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
## 2015-03-01 NOTE — Patient Instructions (Signed)
Ropinirole tablets What is this medicine? ROPINIROLE (roe PIN i role) is used to treat the symptoms of Parkinson's disease. It helps to improve muscle control and movement difficulties. It is also used for the treatment of Restless Legs Syndrome. This medicine may be used for other purposes; ask your health care provider or pharmacist if you have questions. What should I tell my health care provider before I take this medicine? They need to know if you have any of these conditions: -dizzy or fainting spells -heart disease -high blood pressure -kidney disease -liver disease -low blood pressure -sleeping problems -an unusual or allergic reaction to ropinirole, other medicines, foods, dyes, or preservatives -pregnant or trying to get pregnant -breast-feeding How should I use this medicine? Take this medicine by mouth with a glass of water. Follow the directions on the prescription label. You can take it with or without food. If it upsets your stomach, take it with food. Take your doses at regular intervals. Do not take your medicine more often than directed. Do not stop taking this medicine except on your doctor's advice. Talk to your pediatrician regarding the use of this medicine in children. Special care may be needed. Overdosage: If you think you have taken too much of this medicine contact a poison control center or emergency room at once. NOTE: This medicine is only for you. Do not share this medicine with others. What if I miss a dose? If you miss a dose, take it as soon as you can. If it is almost time for your next dose, take only that dose. Do not take double or extra doses. What may interact with this medicine? -ciprofloxacin -male hormones, like estrogens and birth control pills -medicines for depression, anxiety, or psychotic disturbances -metoclopramide -mexiletine -norfloxacin -omeprazole This list may not describe all possible interactions. Give your health care provider  a list of all the medicines, herbs, non-prescription drugs, or dietary supplements you use. Also tell them if you smoke, drink alcohol, or use illegal drugs. Some items may interact with your medicine. What should I watch for while using this medicine? Visit your doctor or health care professional for regular checks on your progress. It may be several weeks or months before you feel the full effect of this medicine. You may get drowsy or dizzy. Do not drive, use machinery, or do anything that needs mental alertness until you know how this drug affects you. Do not stand or sit up quickly, especially if you are an older patient. This reduces the risk of dizzy or fainting spells. Alcohol can increase possible dizziness. Avoid alcoholic drinks. If you find that you have sudden feelings of wanting to sleep during normal activities, like cooking, watching television, or while driving or riding in a car, you should contact your health care professional. Your mouth may get dry. Chewing sugarless gum or sucking hard candy, and drinking plenty of water may help. Contact your doctor if the problem does not go away or is severe. There have been reports of increased sexual urges or other strong urges such as gambling while taking some medicines for Parkinson's disease. If you experience any of these urges while taking this medicine, you should report it to your health care provider as soon as possible. You should check your skin often for changes to moles and new growths while taking this medicine. Call your doctor if you notice any of these changes. What side effects may I notice from receiving this medicine? Side effects that you should  report to your doctor or health care professional as soon as possible: -changes in vision -chest pain -confusion -falling asleep during normal activities like driving -fast, irregular heartbeat -feeling faint or lightheaded, falls -hallucination, loss of contact with  reality -increase or decrease in blood pressure -joint or muscle pain -loss of bladder control -numbness, tingling, or prickly sensations -shortness of breath, troubled breathing, tightness in chest, or wheezing -suicidal thoughts or other mood changes -uncontrollable head, mouth, neck, arm, or leg movements -vomiting Side effects that usually do not require medical attention (report to your doctor or health care professional if they continue or are bothersome): -clumsiness, feeling unsteady, or dizziness, especially early in treatment -flushing -headache -increased sweating -nausea -tremor -yawning This list may not describe all possible side effects. Call your doctor for medical advice about side effects. You may report side effects to FDA at 1-800-FDA-1088. Where should I keep my medicine? Keep out of the reach of children. Store at room temperature between 20 and 25 degrees C (68 and 77 degrees F). Protect from light and moisture. Keep container tightly closed. Throw away any unused medicine after the expiration date. NOTE: This sheet is a summary. It may not cover all possible information. If you have questions about this medicine, talk to your doctor, pharmacist, or health care provider.    2016, Elsevier/Gold Standard. (2008-06-28 20:56:15)

## 2015-04-04 ENCOUNTER — Ambulatory Visit: Payer: Medicare Other | Admitting: Neurology

## 2015-04-04 ENCOUNTER — Telehealth: Payer: Self-pay | Admitting: Neurology

## 2015-04-04 NOTE — Telephone Encounter (Signed)
Wife called to request a call back from Dr. Jaynee Eagles regarding MRI results

## 2015-04-04 NOTE — Telephone Encounter (Signed)
Tried calling [facility] to receive MRI results. They are closed. Unable to LVM. Will try again tomorrow.

## 2015-04-04 NOTE — Telephone Encounter (Signed)
Called wife back. Ok per [employer]. They stated he went to [facility] for MRI a couple weeks ago. Advised I will call and get report faxed to [place]. We will call once we get report. She verbalized understanding. Apologized for the delay.

## 2015-04-05 ENCOUNTER — Other Ambulatory Visit: Payer: Self-pay | Admitting: Neurology

## 2015-04-05 NOTE — Telephone Encounter (Signed)
Called and spoke to [place] [facility]. She would like [place] to fax them a letter head with pt name and DOB and what results we would like. Fax: [phone].

## 2015-04-05 NOTE — Telephone Encounter (Signed)
Spoke to Dr Jaynee Eagles. She has results from MRI. She will contact pt.

## 2015-04-05 NOTE — Telephone Encounter (Signed)
Luke Bennett, I spoke with patient. He actually had a rash when he took the Carbidopa/levodopa so I am going to call him in a different drug.

## 2015-04-06 NOTE — Telephone Encounter (Signed)
Spoke to wife, Nunzio Cory. Ok per [employer]. Pt not available to speak with and she wanted to take a message for him. Relayed Dr Jaynee Eagles message below. She verbalized understanding and would like to pick up patches for husband. Advised we are open 8-5pm tomorrow. She will try to have him pick them up tomorrow. She verbalized understanding. Told her to have him stop medication if he experiences SE and to call office to let us know. Placed 3 boxes neupro patch 49m/24hr up front for pt pick-up.

## 2015-04-06 NOTE — Telephone Encounter (Signed)
Luke Bennett, patient had a reaction to Carbidopa/levodopa so I can give him a different mediction. We have Samples of neupro patches her ein the office if he doesn't mind coming to get it. One patch daily. Side effects can include: low BP and fainting, hallucinations, psychosis, nausea, somnolence, dizzines, vomiting, headache. Can also cause disinhbition - there have been cases of spending money, increased libido, gambling and other addictive behaviors . We can leave it at the front for him. Discard daily. Can leave 3 weeks, he can call after a week and let us know if it is good or if we need to increase it. Thanks

## 2015-04-17 NOTE — Telephone Encounter (Signed)
Wife Nunzio Cory called as advised to give 1 week follow up on Neupro Patch, wife advised patient is doing okay so far, no adverse affects.

## 2015-04-17 NOTE — Telephone Encounter (Signed)
She also feels the tremor is better. Thanks!

## 2015-04-26 ENCOUNTER — Telehealth: Payer: Self-pay | Admitting: Neurology

## 2015-04-26 DIAGNOSIS — G20 Parkinson's disease: Secondary | ICD-10-CM

## 2015-04-26 MED ORDER — ROTIGOTINE 2 MG/24HR TD PT24: 1.0000 | MEDICATED_PATCH | Freq: Every day | TRANSDERMAL | Status: DC

## 2015-04-26 NOTE — Telephone Encounter (Signed)
Sent rx neupro to pt pharmacy.

## 2015-04-26 NOTE — Telephone Encounter (Signed)
Called wife back. She stated pt is taking 44m neupro patch/24hr currently. Advised we no longer carry samples in our office. We will call in rx for him. Verified pt pharmacy. She verbalized understanding.

## 2015-04-26 NOTE — Telephone Encounter (Signed)
Spouse called to advise patient only has 3 patches left from [facility] [employer] samples they were given, will run out before appointment on Monday 05/01/15.

## 2015-04-27 NOTE — Telephone Encounter (Signed)
Wife called to advise, they cannot afford the NEUPRO patch.

## 2015-04-27 NOTE — Telephone Encounter (Signed)
Called wife back. Advised I called pharmacy and was able to do 30 day free trial for neupro patch. BIN: [id number] Group: [id number] ID: [id number]. Shredded used Chief Technology Officer. Pharmacist stated rx will be ready to be picked up tomorrow. Wife aware and states she called neupro company and they are sending her pt assistant paperwork to fill out. She states pt has enough patches to get him through tomorrow night.

## 2015-05-01 ENCOUNTER — Ambulatory Visit: Payer: Medicare Other | Admitting: Neurology

## 2015-05-01 ENCOUNTER — Ambulatory Visit (INDEPENDENT_AMBULATORY_CARE_PROVIDER_SITE_OTHER): Payer: Medicare Other | Admitting: Neurology

## 2015-05-01 VITALS — BP 124/77 | HR 89 | Ht 66.0 in | Wt 213.0 lb

## 2015-05-01 DIAGNOSIS — G20 Parkinson's disease: Secondary | ICD-10-CM

## 2015-05-01 MED ORDER — ROTIGOTINE 4 MG/24HR TD PT24: 1.0000 | MEDICATED_PATCH | Freq: Every day | TRANSDERMAL | Status: DC

## 2015-05-01 NOTE — Progress Notes (Signed)
[facility]    Provider:  Dr Jaynee Eagles Referring Provider: Katherina Mires, MD Primary Care Phcian:  Suzanna Obey, MD   CC: parkinsonism  Interval history 05/01/2015: Patient and wife come into the office today to review DAT scan. DAT scan consistent with Parkinson's disease. Patient has been on Seroquel 300 mg and has a long psychiatric history for which multiple medications have been tried. It is not possible for him to stop or decrease the Seroquel.   The potentially interfering drugs consist of: amoxapine, amphetamine, benztropine, bupropion, buspirone, citalopram, cocaine, mazindol, methamphetamine, methylphenidate, norephedrine, phentermine, escitalopram,  phenylpropanolamine, selegiline, paroxetine, and sertraline of which patient is not taking. Patient could not stop the Seroquel and this drug likely will not interfere wtth the results. . He is seen at response to the Neupro patch 2 mg and feels as though his symptoms and tremor are better. We will increase it to 4 mg. Discussed potential side effects of dopaminergic agents that include: Common side effects of Neupro (rotigotine transdermal system) include weight gain, patch application site reactions (swelling, redness, or itching), urinating more than usual, runny nose, diarrhea, loss of appetite, nausea, vomiting, dizziness, lightheadedness, tiredness, weakness, drowsiness, sleepiness, headache. Serious side effects can include sleep attacks, hypotension, decreased inhibition and compulsive behaviors and others. Stop for anything concerning.   HPI: Luke Bennett is a 70 y.o. male here as a referral from Dr. Doreene Nest for tremor. He has a PMHx of Schizoaffective disorder on Seroquel, depression, anxiety, TIA, MI, Crohn's Dz, thrombocytopenia, CKD, glucose intolerance. He is here with his wife who provides information. He acts out his dreams, she almost got clobbered the other night, hit her pillow. His resting tremor  continues, feels it is worsening. Discussed MRI of the brain which was normal. Discussed his previous history of dopamine-blocking agents and his parkinsonism. Can try Sinemet and see how he responds, warned that it can cause confusion, hallucinations which is worrisome given patient's past medical history. He is on Seroquel currently. I have asked him to follow with psychiatry as well.   Previous history: He has a worsening tremor, started a few months ago. Also noticing that his right shoulder is lower than the other and holds his hand on his stomach when he walks possibly to try and keep it from trembling. He is having a harder time remembering things. Memory is less than they used to be. Walking is slower but steady. He gets tired easily. No falls. He has had several episdes at night with feelings like he was going to collapse. He is changing a lot, declining. He will forget things in 15 minutes, wife will tell him something and he forgets, has to be reminded. He forgets things he does every day, like setting the table - forgets things that go on the table. He is getitng easily confused by tasks. Wife pays the bills. He does not drive. He is on Humira for Crohn's disease. They notice the tremor all the time. Mostly with resting or walking, sitting or eating, less with use. Doesn't happen when sleeping. Smell is fine. No constipation. No problems swallowing. Handwriting is getting poorer, shaky and smaller. No other focal neurologic symptoms. Denies hypophonia, shuffling. No other focal neurologic deficits.   Reviewed notes, labs and imaging from outside physicians, which showed: Notes from Dr. Suzanna Obey also document that patient's tremor has been worsening, right hand only, at rest. Does not affect ADLs. No gait instability. She was also concern for idiopathic Parkinson's disease versus TIA  or stroke. He is on Humira for Crohn's disease. She also noted right greater than left cogwheeling on  exam.  Review of Systems: Patient complains of symptoms per HPI as well as the following symptoms: tremor. No CP, no SOB. Pertinent negatives per HPI. All others negative.   Social History   Social History  . Marital Status: Married    Spouse Name: N/A  . Number of Children: N/A  . Years of Education: N/A   Occupational History  . Retired    Social History Main Topics  . Smoking status: Former Research scientist (life sciences)  . Smokeless tobacco: Not on file  . Alcohol Use: No  . Drug Use: No  . Sexual Activity: Not on file   Other Topics Concern  . Not on file   Social History Narrative   Lives at home with wife and in-laws.   Caffeine use: none       Family History  Problem Relation Age of Onset  . Hypertension Sister   . Hodgkin's lymphoma Sister   . Cerebral aneurysm Father   . Parkinsonism Neg Hx     Past Medical History  Diagnosis Date  . Depression   . Anxiety   . Heart attack ([place])   . TIA (transient ischemic attack)   . Crohn's disease ([place])   . Fatty liver     Past Surgical History  Procedure Laterality Date  . Abdominal surgery    . Ostomy    . Tonsillectomy    . Colectomy    . Stoma dilatation      Current Outpatient Prescriptions  Medication Sig Dispense Refill  . adalimumab (HUMIRA PEN) 40 MG/0.8ML injection Inject 40 mg into the skin every 14 (fourteen) days. Due Monday     12/07/12    . ammonium lactate (AMLACTIN) 12 % cream Apply 1 application topically as needed.    . calcium carbonate (OS-CAL) 600 MG TABS tablet Take 600 mg by mouth daily with breakfast.     . Menthol-Zinc Oxide (CALMOSEPTINE) 0.44-20.625 % OINT Apply 1 application topically QID. 120 g 2  . Multiple Vitamin (MULTIVITAMIN WITH MINERALS) TABS tablet Take 1 tablet by mouth daily.    . QUEtiapine (SEROQUEL) 300 MG tablet Take 300 mg by mouth daily.  4  . rOPINIRole (REQUIP) 0.25 MG tablet Take 1 tablet (0.25 mg total) by mouth at bedtime. 30 tablet 1  . rotigotine (NEUPRO) 2 MG/24HR Place 1  patch onto the skin daily. 30 patch 0  . trihexyphenidyl (ARTANE) 2 MG tablet Take 2 mg by mouth daily. At 7pm     No current facility-administered medications for this visit.    Allergies as of 05/01/2015 - Review Complete 05/01/2015  Allergen Reaction Noted  . Aspirin  12/04/2012  . Celecoxib  12/04/2012  . Sinemet [carbidopa-levodopa] Rash 03/01/2015    Vitals: BP 124/77 mmHg  Pulse 89  Ht 5' 6"  (1.676 m)  Wt 213 lb (96.616 kg)  BMI 34.40 kg/m2 Last Weight:  Wt Readings from Last 1 Encounters:  05/01/15 213 lb (96.616 kg)   Last Height:   Ht Readings from Last 1 Encounters:  05/01/15 5' 6"  (1.676 m)    Neuro: Detailed Neurologic Exam  Speech:  Speech is normal; fluent and spontaneous with normal comprehension.  Cognition:  The patient is oriented to person, place, and time;   Cranial Nerves: Hypomimia  The pupils are equal, round, and reactive to light. The fundi are flat. Visual fields are full to finger confrontation. Impaired  upgaze, otherwise extraocular movements are intact. Trigeminal sensation is intact and the muscles of mastication are normal. The face is symmetric. The palate elevates in the midline. Hearing intact. Voice is normal. Shoulder shrug is normal. The tongue has normal motion without fasciculations.   Coordination:  Right arm bradykinesia compared to left  Gait:  Decreased arm right swing and re-emergent right hand tremor.   Motor Observation:  Mild right resting tremor with some postural components Tone:  Cogwheeling upper right extremity with facilitation  Posture:  Slightly stooped.   Strength:  Strength is V/V in the upper and lower limbs.    Sensation: intact to LT   Reflex Exam:   Assessment/Plan: 70 y.o. male here as a referral from Dr. Doreene Nest for resting tremor. He has a PMHx of Schizoaffective disorder on Seroquel, depression, anxiety, TIA, MI, Crohn's Dz, thrombocytopenia, CKD, glucose  intolerance who is here for eval of tremor and memory changes. Neuro exam significant for resting tremor with postural components, right arm cogwheeling with facilitation, decreased right arm swing on walking with emergent tremor, decreased blink reflex, right hand bradykinesia. Consistent with parkinsonism. Patient is on dopamine blocking agent, unclear if this is secondary to medication effect or if he is developing idiopathic PD, other neurodegenerative disorder. However seroquel is the least likely of the atypical antidepressants to cause extrapyramidal side effects. MRi of the brain normal.  DAT scan c/w Parkinson's disease. He has responded well to Neupro patch. Will increase to 69m.  ASarina Ill MD  [facility] [address]icks[address] [place] [address] Phone [phone] [phone] A total of 40 minutes was spent face-to-face with this patient. Over half this time was spent on counseling patient on the parkinson's disease diagnosis and different diagnostic and therapeutic options available.

## 2015-05-01 NOTE — Patient Instructions (Signed)
Remember to drink plenty of fluid, eat healthy meals and do not skip any meals. Try to eat protein with a every meal and eat a healthy snack such as fruit or nuts in between meals. Try to keep a regular sleep-wake schedule and try to exercise daily, particularly in the form of walking, 20-30 minutes a day, if you can.   As far as your medications are concerned, I would like to suggest: Increase the neupro patch to 68m patch daily  I would like to see you back in 3 months, sooner if we need to. Please call [place] any interim questions, concerns, problems, updates or refill requests.   Please also call [place]for any test results so we can go over those with you on the phone.  My clinical assistant and will answer any of your questions and relay your messages to me and also relay most of my messages to you.   Our phone number is [phone] We also have an after hours call service for urgent matters and there is a physician on-call for urgent questions. For any emergencies you know to call 911 or go to the nearest emergency room

## 2015-05-02 DIAGNOSIS — G20 Parkinson's disease: Secondary | ICD-10-CM | POA: Insufficient documentation

## 2015-05-09 ENCOUNTER — Telehealth: Payer: Self-pay | Admitting: Neurology

## 2015-05-09 NOTE — Telephone Encounter (Signed)
Returned call. She is going to drop off paperwork tomorrow. She stated physician office needs to complete paperwork.

## 2015-05-09 NOTE — Telephone Encounter (Signed)
Patient's wife is calling and states she has a form to be filled out for financial assistance with Rx Neupro patches.  Please call.

## 2015-05-11 ENCOUNTER — Other Ambulatory Visit: Payer: Self-pay | Admitting: *Deleted

## 2015-05-11 MED ORDER — ROTIGOTINE 4 MG/24HR TD PT24: 1.0000 | MEDICATED_PATCH | Freq: Every day | TRANSDERMAL | Status: DC

## 2015-05-11 NOTE — Telephone Encounter (Signed)
Patient came to Drop Patient assistance from off.  Emma please attach RX and fill highlighted areas I will call the patient and process for you. Thanks Hinton Dyer.

## 2015-05-11 NOTE — Telephone Encounter (Signed)
Called and spoke to patient's wife and relayed patient asst. Was ready at front desk for pickup.  RX attached Neupro patch. Relayed office hours.

## 2015-06-06 ENCOUNTER — Telehealth: Payer: Self-pay | Admitting: Neurology

## 2015-06-06 NOTE — Telephone Encounter (Signed)
[facility] pt assistance program has called and they need to verify rx. May call [phone] option 2, then 2 again. It can be verbally done.

## 2015-06-06 NOTE — Telephone Encounter (Signed)
Returned call. Spoke to Stanton. She took pt name and DOB. Clarified rx rotigotine (NEUPRO) 4 MG/24HR.  They wanted to verify it came from [place]. She is going to make a note. She needs nothing further at this time.

## 2015-07-03 ENCOUNTER — Other Ambulatory Visit: Payer: Self-pay | Admitting: Neurology

## 2015-08-01 ENCOUNTER — Ambulatory Visit (INDEPENDENT_AMBULATORY_CARE_PROVIDER_SITE_OTHER): Payer: Medicare Other | Admitting: Neurology

## 2015-08-01 ENCOUNTER — Other Ambulatory Visit: Payer: Self-pay | Admitting: Neurology

## 2015-08-01 VITALS — BP 107/68 | HR 90 | Ht 66.0 in | Wt 211.0 lb

## 2015-08-01 DIAGNOSIS — G20 Parkinson's disease: Secondary | ICD-10-CM

## 2015-08-01 MED ORDER — AMANTADINE HCL 100 MG PO CAPS: ORAL_CAPSULE | ORAL | Status: DC

## 2015-08-01 NOTE — Progress Notes (Signed)
[facility]    Provider:  Dr Jaynee Eagles Referring Provider: Katherina Mires, MD Primary Care Physician:  Suzanna Obey, MD  Provider: Dr Jaynee Eagles Referring Provider: Katherina Mires, MD Primary Care Phcian: Suzanna Obey, MD   CC: Parkinson's disease  Interval history 08/01/2015: Patient is doing very well and Neupro patch and his tremor is much improved. He still has a very slight right hand resting tremor. He is doing much better on the neupro patch. Patient is slowing down. He feels quite fatigued. Feels as though he is walking slower. Discussed this today. We'll hold off on increasing the Neupro patch. Try amantadine as this is use for fatigue and that is also used a tremor and Parkinson's. Discussed side effects.  Interval history 05/01/2015: Patient and wife come into the office today to review DAT scan. DAT scan consistent with Parkinson's disease. Patient has been on Seroquel 300 mg and has a long psychiatric history for which multiple medications have been tried. It is not possible for him to stop or decrease the Seroquel.   The potentially interfering drugs consist of: amoxapine, amphetamine, benztropine, bupropion, buspirone, citalopram, cocaine, mazindol, methamphetamine, methylphenidate, norephedrine, phentermine, escitalopram, phenylpropanolamine, selegiline, paroxetine, and sertraline of which patient is not taking. Patient could not stop the Seroquel and this drug likely will not interfere wtth the results. [name]  HPI: Luke Bennett is a 70 y.o. male here as a referral from Dr. Doreene Nest for tremor. He has a PMHx of Schizoaffective disorder on Seroquel, depression, anxiety, TIA, MI, Crohn's Dz, thrombocytopenia, CKD, glucose intolerance. He is here with his wife who provides information. He acts out his dreams, she almost got clobbered the other night, hit her pillow. His resting tremor continues, feels it is worsening. Discussed MRI of the brain which was normal. Discussed  his previous history of dopamine-blocking agents and his parkinsonism. Can try Sinemet and see how he responds, warned that it can cause confusion, hallucinations which is worrisome given patient's past medical history. He is on Seroquel currently. I have asked him to follow with psychiatry as well.   Previous history: He has a worsening tremor, started a few months ago. Also noticing that his right shoulder is lower than the other and holds his hand on his stomach when he walks possibly to try and keep it from trembling. He is having a harder time remembering things. Memory is less than they used to be. Walking is slower but steady. He gets tired easily. No falls. He has had several episdes at night with feelings like he was going to collapse. He is changing a lot, declining. He will forget things in 15 minutes, wife will tell him something and he forgets, has to be reminded. He forgets things he does every day, like setting the table - forgets things that go on the table. He is getitng easily confused by tasks. Wife pays the bills. He does not drive. He is on Humira for Crohn's disease. They notice the tremor all the time. Mostly with resting or walking, sitting or eating, less with use. Doesn't happen when sleeping. Smell is fine. No constipation. No problems swallowing. Handwriting is getting poorer, shaky and smaller. No other focal neurologic symptoms. Denies hypophonia, shuffling. No other focal neurologic deficits.   Reviewed notes, labs and imaging from outside physicians, which showed: Notes from Dr. Suzanna Obey also document that patient's tremor has been worsening, right hand only, at rest. Does not affect ADLs. No gait instability. She was also concern for idiopathic  Parkinson's disease versus TIA or stroke. He is on Humira for Crohn's disease. She also noted right greater than left cogwheeling on exam.  Review of Systems: Patient complains of symptoms per HPI as well as the following symptoms:  tremor. No CP, no SOB. Pertinent negatives per HPI. All others negative.  Social History   Social History  . Marital Status: Married    Spouse Name: N/A  . Number of Children: N/A  . Years of Education: N/A   Occupational History  . Retired    Social History Main Topics  . Smoking status: Former Research scientist (life sciences)  . Smokeless tobacco: Not on file  . Alcohol Use: No  . Drug Use: No  . Sexual Activity: Not on file   Other Topics Concern  . Not on file   Social History Narrative   Lives at home with wife and in-laws.   Caffeine use: none       Family History  Problem Relation Age of Onset  . Hypertension Sister   . Hodgkin's lymphoma Sister   . Cerebral aneurysm Father   . Parkinsonism Neg Hx     Past Medical History  Diagnosis Date  . Depression   . Anxiety   . Heart attack ([place])   . TIA (transient ischemic attack)   . Crohn's disease ([place])   . Fatty liver     Past Surgical History  Procedure Laterality Date  . Abdominal surgery    . Ostomy    . Tonsillectomy    . Colectomy    . Stoma dilatation      Current Outpatient Prescriptions  Medication Sig Dispense Refill  . adalimumab (HUMIRA PEN) 40 MG/0.8ML injection Inject 40 mg into the skin every 14 (fourteen) days. Due Monday     12/07/12    . ammonium lactate (AMLACTIN) 12 % cream Apply 1 application topically as needed.    . calcium carbonate (OS-CAL) 600 MG TABS tablet Take 600 mg by mouth daily with breakfast.     . Menthol-Zinc Oxide (CALMOSEPTINE) 0.44-20.625 % OINT Apply 1 application topically QID. 120 g 2  . Multiple Vitamin (MULTIVITAMIN WITH MINERALS) TABS tablet Take 1 tablet by mouth daily.    . QUEtiapine (SEROQUEL) 300 MG tablet Take 300 mg by mouth daily.  4  . rOPINIRole (REQUIP) 0.25 MG tablet TAKE ONE TABLET BY MOUTH AT BEDTIME 30 tablet 0  . rotigotine (NEUPRO) 4 MG/24HR Place 1 patch onto the skin daily. Use one patch daily as instructed 30 patch 11  . trihexyphenidyl (ARTANE) 2 MG tablet Take 2  mg by mouth daily. At 7pm     No current facility-administered medications for this visit.    Allergies as of 08/01/2015 - Review Complete 05/01/2015  Allergen Reaction Noted  . Aspirin  12/04/2012  . Celecoxib  12/04/2012  . Sinemet [carbidopa-levodopa] Rash 03/01/2015    Vitals: BP 107/68 mmHg  Pulse 90  Ht 5' 6"  (1.676 m)  Wt 211 lb (95.709 kg)  BMI 34.07 kg/m2 Last Weight:  Wt Readings from Last 1 Encounters:  08/01/15 211 lb (95.709 kg)   Last Height:   Ht Readings from Last 1 Encounters:  08/01/15 5' 6"  (1.676 m)     Neuro: Detailed Neurologic Exam  Speech:  Speech is normal; fluent and spontaneous with normal comprehension.  Cognition:  The patient is oriented to person, place, and time;   Cranial Nerves: Hypomimia  The pupils are equal, round, and reactive to light. The fundi are flat. Visual  fields are full to finger confrontation. Impaired upgaze, otherwise extraocular movements are intact. Trigeminal sensation is intact and the muscles of mastication are normal. The face is symmetric. The palate elevates in the midline. Hearing intact. Voice is normal. Shoulder shrug is normal. The tongue has normal motion without fasciculations.   Coordination:  Right arm bradykinesia compared to left  Gait:  Decreased arm right swing and re-emergent right hand tremor.   Motor Observation:  Very minimal (improved) right resting tremor with some postural components Tone:  Cogwheeling upper right extremity with facilitation  Posture:  Slightly stooped.   Strength:  Strength is V/V in the upper and lower limbs.    Sensation: intact to LT   Reflex Exam:   Assessment/Plan: 70 y.o. male here as a referral from Dr. Doreene Nest for resting tremor. He has a PMHx of Schizoaffective disorder on Seroquel, depression, anxiety, TIA, MI, Crohn's Dz, thrombocytopenia, CKD, glucose intolerance who is here for eval of tremor and memory changes. Neuro  exam significant for resting tremor with postural components, right arm cogwheeling with facilitation, decreased right arm swing on walking with emergent tremor, decreased blink reflex, right hand bradykinesia. Consistent with parkinsonism. Patient is on dopamine blocking agent, unclear if this is secondary to medication effect or if he is developing idiopathic PD, other neurodegenerative disorder. However seroquel is the least likely of the atypical antidepressants to cause extrapyramidal side effects. MRi of the brain normal.  DAT scan was performed to try and evaluate for the different causes of parkinsonism, c/w Parkinson's disease (markedly abnormal DAT scan with significantly decreased tracer on the bilateral putamen and left caudate. This pattern of activity can be seen with Parkinson's disease or related syndromes.). He has responded well to Neupro patch. Will continue 14m.  Tremor  Improved, feeling more fatigued however and still with a very minimal resting right tremor. Discussed options. Will try amantadine instead of increasing Neupro patch. Amantadine is used in treating Parkinson's disease tremor and is also used for fatigue and other conditions. This may be beneficial on both counts.   Symmetrel (amantadine hydrochloride) is an anti-Parkinson and an antiviral drug prescribed to prevent or treat symptoms of infection caused by strains of influenza A virus. Symmetrel is also used in the treatment of Parkinson's Disease and similar conditions. Symmeterel is available in generic form. Common side effects of Symmetrel include:nausea, stomach upset, diarrhea, constipation, loss of appetite, dizziness, drowsiness, headache, sleep problems (insomnia), strange dreams, nervous feeling, dry mouth, dry nose, blurred vision, or loss of balance or coordination.  Tell your doctor if you have unlikely but serious side effects of Symmetrel including purplish-red blotchy spots on the skin, swelling of the  ankles or feet, difficulty urinating, or vision changes.  This is not a complete list of side effects and others may occur. Call your doctor for medical advice about side effects. You may report side effects to FDA at 1-800-FDA-1088. You may report side effects to FDA at 1-800-FDA-1088.   ASarina Ill MD  [facility] [address] [place] [address] Phone [phone] [phone] A total of 30 minutes was spent face-to-face with this patient. Over half this time was spent on counseling patient on the parkinson's disease diagnosis and different diagnostic and therapeutic options available.

## 2015-08-01 NOTE — Patient Instructions (Signed)
Remember to drink plenty of fluid, eat healthy meals and do not skip any meals. Try to eat protein with a every meal and eat a healthy snack such as fruit or nuts in between meals. Try to keep a regular sleep-wake schedule and try to exercise daily, particularly in the form of walking, 20-30 minutes a day, if you can.   As far as your medications are concerned, I would like to suggest: Amantadine 160m. Start with one pill a day then can increase to 2 pills a day  I would like to see you back in 6 months, sooner if we need to. Please call u[place]ith any interim questions, concerns, problems, updates or refill requests.   Our phone number is [phone] We also have an after hours call service for urgent matters and there is a physician on-call for urgent questions. For any emergencies you know to call 911 or go to the nearest emergency room

## 2015-08-31 ENCOUNTER — Telehealth: Payer: Self-pay | Admitting: Neurology

## 2015-08-31 DIAGNOSIS — G2581 Restless legs syndrome: Secondary | ICD-10-CM

## 2015-08-31 MED ORDER — ROPINIROLE HCL 0.25 MG PO TABS: 0.2500 mg | ORAL_TABLET | Freq: Every day | ORAL | Status: DC

## 2015-08-31 NOTE — Telephone Encounter (Signed)
Wife called to request refill of rOPINIRole (REQUIP) 0.25 MG tablet, requests increase in dosage due to still having trouble with legs.

## 2015-08-31 NOTE — Telephone Encounter (Signed)
error 

## 2015-08-31 NOTE — Telephone Encounter (Signed)
Would you like to increase pt's requip? He is currently taking requip 0.1m tablet at bedtime but it is still "having trouble with his legs."

## 2015-09-01 MED ORDER — ROPINIROLE HCL 0.25 MG PO TABS: 0.5000 mg | ORAL_TABLET | Freq: Every day | ORAL | Status: DC

## 2015-09-01 NOTE — Addendum Note (Signed)
Addended by: Larey Seat on: 09/01/2015 10:21 AM   Modules accepted: Orders

## 2015-12-28 ENCOUNTER — Telehealth: Payer: Self-pay | Admitting: Neurology

## 2015-12-28 NOTE — Telephone Encounter (Signed)
Wife called requesting to speak with Dr. Jaynee Eagles. States "husband has Parkinson's and has been on QUEtiapine (SEROQUEL) 300 MG tablet for years and years and years, in the past husband took ARTANE however was taken off of Artane years ago and Psychiatrist's never put him back on it. Dr. Judeth Porch put husband back on Artane recently" and wife asks, should husband be on Artane while on Seroquel? Please call to advise.

## 2015-12-28 NOTE — Telephone Encounter (Signed)
Dr Jaynee Eagles- please advise. See message below  Called wife back. Advised Dr Jaynee Eagles is out of the office  Until Monday. She states she is okay with waiting to get an answer back from Dr Jaynee Eagles until she is back. She prefers Dr Jaynee Eagles since she is his doctor. She would like her input on this. Advised I will send her the message to review and we will call back to advise next week. She verbalized understanding.

## 2016-01-01 NOTE — Telephone Encounter (Signed)
I think that is fine, artane is use din parkinson's disease thanks

## 2016-01-01 NOTE — Telephone Encounter (Signed)
Dr Jaynee Eagles- FYI Called wife back. Relayed per Dr Jaynee Eagles that she thinks it is fine for patient to take artane. It is used in parkinson's disease. She verbalized understanding and wanted to let Dr Jaynee Eagles know "thank you". Advised I will give her the message.

## 2016-01-30 ENCOUNTER — Telehealth: Payer: Self-pay | Admitting: Neurology

## 2016-01-30 DIAGNOSIS — G2581 Restless legs syndrome: Secondary | ICD-10-CM

## 2016-01-30 MED ORDER — ROPINIROLE HCL 0.25 MG PO TABS: ORAL_TABLET | ORAL | 3 refills | Status: DC

## 2016-01-30 NOTE — Telephone Encounter (Signed)
Yes, it can- but he may do better with an increase in Nupro patch. Please tell her I Allow for 3 ropinorol at evening time and will follow up in 2-3 month. CD

## 2016-01-30 NOTE — Telephone Encounter (Signed)
I spoke to pt's wife, Nunzio Cory, per DPR. She says that pt has been having trouble sleeping due to his RLS. Pt is currently taking ropinirole 0.25 mg tablets 2 tablets at bedtime (total of 0.34m). Pt is also on a neupro patch.  Pt's wife is wondering if the ropinirole can be increased further?

## 2016-01-30 NOTE — Telephone Encounter (Signed)
Patient wife is calling to discuss increasing medication rOPINIRole (REQUIP) 0.25 MG tablet for restless legs for the patient. He is having trouble sleeping. Please call to discuss.

## 2016-01-30 NOTE — Addendum Note (Signed)
Addended by: Larey Seat on: 01/30/2016 04:27 PM   Modules accepted: Orders

## 2016-01-30 NOTE — Telephone Encounter (Signed)
I spoke to pt's wife and advised her that Dr. Brett Fairy has increased the ropinirole to 3 tablets at bedtime. Potential medication side effects were discussed with the patient's wife. Pt's wife asked that the RX be faxed to [facility] on [address].  A follow up appt with Dr. Brett Fairy was made for 04/24/2016 at 11:00am. Pt's wife verbalized understanding.

## 2016-02-01 ENCOUNTER — Ambulatory Visit: Payer: Medicare Other | Admitting: Neurology

## 2016-02-07 ENCOUNTER — Ambulatory Visit (INDEPENDENT_AMBULATORY_CARE_PROVIDER_SITE_OTHER): Payer: Medicare Other | Admitting: Neurology

## 2016-02-07 VITALS — BP 128/78 | HR 74 | Ht 66.0 in | Wt 203.0 lb

## 2016-02-07 DIAGNOSIS — G20 Parkinson's disease: Secondary | ICD-10-CM | POA: Diagnosis not present

## 2016-02-07 NOTE — Progress Notes (Signed)
[facility]    Provider:  Dr Jaynee Eagles Referring Provider: Katherina Mires, MD Primary Care Physician:  Suzanna Obey, MD   CC: Parkinson's disease  Interval history: His tremor is doing well. He is slowing down. No new symptoms. He is on Artane. Will try 65m neupro patch. Discussed options, decided to increase patch, if he does well call for new prescription.   Interval history 08/01/2015: Patient is doing very well and Neupro patch and his tremor is much improved. He still has a very slight right hand resting tremor. He is doing much better on the neupro patch. Patient is slowing down. He feels quite fatigued. Feels as though he is walking slower. Discussed this today. We'll hold off on increasing the Neupro patch. Try amantadine as this is use for fatigue and that is also used a tremor and Parkinson's. Discussed side effects.  Interval history 05/01/2015: Patient and wife come into the office today to review DAT scan. DAT scan consistent with Parkinson's disease. Patient has been on Seroquel 300 mg and has a long psychiatric history for which multiple medications have been tried. It is not possible for him to stop or decrease the Seroquel.   The potentially interfering drugs consist of: amoxapine, amphetamine, benztropine, bupropion, buspirone, citalopram, cocaine, mazindol, methamphetamine, methylphenidate, norephedrine, phentermine, escitalopram, phenylpropanolamine, selegiline, paroxetine, and sertraline of which patient is not taking. Patient could not stop the Seroquel and this drug likely will not interfere wtth the results. .[name] HPI: Luke Bennett a 70y.o. male here as a referral from Dr. BDoreene Nestfor tremor. He has a PMHx of Schizoaffective disorder on Seroquel, depression, anxiety, TIA, MI, Crohn's Dz, thrombocytopenia, CKD, glucose intolerance. He is here with his wife who provides information. He acts out his dreams, she almost got clobbered the other night, hit  her pillow. His resting tremor continues, feels it is worsening. Discussed MRI of the brain which was normal. Discussed his previous history of dopamine-blocking agents and his parkinsonism. Can try Sinemet and see how he responds, warned that it can cause confusion, hallucinations which is worrisome given patient's past medical history. He is on Seroquel currently. I have asked him to follow with psychiatry as well.   Previous history: He has a worsening tremor, started a few months ago. Also noticing that his right shoulder is lower than the other and holds his hand on his stomach when he walks possibly to try and keep it from trembling. He is having a harder time remembering things. Memory is less than they used to be. Walking is slower but steady. He gets tired easily. No falls. He has had several episdes at night with feelings like he was going to collapse. He is changing a lot, declining. He will forget things in 15 minutes, wife will tell him something and he forgets, has to be reminded. He forgets things he does every day, like setting the table - forgets things that go on the table. He is getitng easily confused by tasks. Wife pays the bills. He does not drive. He is on Humira for Crohn's disease. They notice the tremor all the time. Mostly with resting or walking, sitting or eating, less with use. Doesn't happen when sleeping. Smell is fine. No constipation. No problems swallowing. Handwriting is getting poorer, shaky and smaller. No other focal neurologic symptoms. Denies hypophonia, shuffling. No other focal neurologic deficits.   Reviewed notes, labs and imaging from outside physicians, which showed: Notes from Dr. KSuzanna Obeyalso document that  patient's tremor has been worsening, right hand only, at rest. Does not affect ADLs. No gait instability. She was also concern for idiopathic Parkinson's disease versus TIA or stroke. He is on Humira for Crohn's disease. She also noted right greater than  left cogwheeling on exam.  Review of Systems: Patient complains of symptoms per HPI as well as the following symptoms: tremor. No CP, no SOB. Pertinent negatives per HPI. All others negative.   Social History   Social History  . Marital status: Married    Spouse name: N/A  . Number of children: N/A  . Years of education: N/A   Occupational History  . Retired    Social History Main Topics  . Smoking status: Former Research scientist (life sciences)  . Smokeless tobacco: Not on file  . Alcohol use No  . Drug use: No  . Sexual activity: Not on file   Other Topics Concern  . Not on file   Social History Narrative   Lives at home with wife and in-laws.   Caffeine use: none       Family History  Problem Relation Age of Onset  . Hypertension Sister   . Hodgkin's lymphoma Sister   . Cerebral aneurysm Father   . Parkinsonism Neg Hx     Past Medical History:  Diagnosis Date  . Anxiety   . Crohn's disease ([place])   . Depression   . Fatty liver   . Heart attack   . TIA (transient ischemic attack)     Past Surgical History:  Procedure Laterality Date  . ABDOMINAL SURGERY    . COLECTOMY    . OSTOMY    . STOMA DILATATION    . TONSILLECTOMY      Current Outpatient Prescriptions  Medication Sig Dispense Refill  . adalimumab (HUMIRA PEN) 40 MG/0.8ML injection Inject 40 mg into the skin every 14 (fourteen) days. Due Monday     12/07/12    . amantadine (SYMMETREL) 100 MG capsule Start with one 138m tablet daily for 3 days then increase to one 104mtablet twice a day 60 capsule 12  . ammonium lactate (AMLACTIN) 12 % cream Apply 1 application topically as needed.    . calcium carbonate (OS-CAL) 600 MG TABS tablet Take 600 mg by mouth daily with breakfast.     . Menthol-Zinc Oxide (CALMOSEPTINE) 0.44-20.625 % OINT Apply 1 application topically QID. 120 g 2  . Multiple Vitamin (MULTIVITAMIN WITH MINERALS) TABS tablet Take 1 tablet by mouth daily.    . QUEtiapine (SEROQUEL) 300 MG tablet Take 300 mg  by mouth daily.  4  . rOPINIRole (REQUIP) 0.25 MG tablet 3 at bedtime. 240 tablet 3  . rotigotine (NEUPRO) 4 MG/24HR Place 1 patch onto the skin daily. Use one patch daily as instructed 30 patch 11  . trihexyphenidyl (ARTANE) 2 MG tablet Take 2 mg by mouth daily. At 7pm     No current facility-administered medications for this visit.     Allergies as of 02/07/2016 - Review Complete 02/07/2016  Allergen Reaction Noted  . Aspirin  12/04/2012  . Celecoxib  12/04/2012  . Sinemet [carbidopa-levodopa] Rash 03/01/2015    Vitals: BP 128/78 (BP Location: Right Arm, Patient Position: Sitting, Cuff Size: Normal)   Pulse 74   Ht 5' 6"  (1.676 m)   Wt 203 lb (92.1 kg)   SpO2 95%   BMI 32.77 kg/m  Last Weight:  Wt Readings from Last 1 Encounters:  02/07/16 203 lb (92.1 kg)   Last Height:  Ht Readings from Last 1 Encounters:  02/07/16 5' 6"  (1.676 m)     Neuro: Detailed Neurologic Exam  Speech:  Speech is normal; fluent and spontaneous with normal comprehension.  Cognition:  The patient is oriented to person, place, and time;   Cranial Nerves: Hypomimia  The pupils are equal, round, and reactive to light. The fundi are flat. Visual fields are full to finger confrontation. Impaired upgaze, otherwise extraocular movements are intact. Trigeminal sensation is intact and the muscles of mastication are normal. The face is symmetric. The palate elevates in the midline. Hearing intact. Voice is normal. Shoulder shrug is normal. The tongue has normal motion without fasciculations.   Coordination:  Right arm bradykinesia compared to left  Gait:  Decreased arm right swing and re-emergent right hand tremor. Improved  Motor Observation:  Very minimal (improved) right resting tremor with some postural components Tone:  Cogwheeling upper right extremity with facilitation. Much improved.   Posture:  Slightly stooped.   Strength:  Strength is V/V in the upper  and lower limbs.    Sensation: intact to LT    Assessment/Plan: 70 y.o. male here as a referral from Dr. Doreene Nest for resting tremor. He has a PMHx of Schizoaffective disorder on Seroquel, depression, anxiety, TIA, MI, Crohn's Dz, thrombocytopenia, CKD, glucose intolerance who is here for eval of tremor and memory changes. Neuro exam significant for resting tremor with postural components, right arm cogwheeling with facilitation, decreased right arm swing on walking with emergent tremor, decreased blink reflex, right hand bradykinesia. Consistent with parkinsonism. Patient is on dopamine blocking agent, unclear if this is secondary to medication effect or if he is developing idiopathic PD, other neurodegenerative disorder. However seroquel is the least likely of the atypical antidepressants to cause extrapyramidal side effects. MRi of the brain normal.  DAT scan was performed to try and evaluate for the different causes of parkinsonism, c/w Parkinson's disease (markedly abnormal DAT scan with significantly decreased tracer on the bilateral putamen and left caudate. This pattern of activity can be seen with Parkinson's disease or related syndromes.). He has responded well to Neupro patch. Will continue 21m.  Tremor  Improved, feeling more fatigued however and still with a very minimal resting right tremor. Discussed options. Will continue amantadine. Amantadine is used in treating Parkinson's disease tremor and is also used for fatigue and other conditions. This may be beneficial on both counts. Will increase neupro to 672mpatch. Continue Artane.    AnSarina IllMD  [facility] [address] [address]Phone [phone] total of 30 minutes was spent face-to-face with this patient. Over half this time was spent on counseling patient on the parkinson's disease diagnosis and different diagnostic and therapeutic  options available.

## 2016-02-07 NOTE — Patient Instructions (Signed)
Remember to drink plenty of fluid, eat healthy meals and do not skip any meals. Try to eat protein with a every meal and eat a healthy snack such as fruit or nuts in between meals. Try to keep a regular sleep-wake schedule and try to exercise daily, particularly in the form of walking, 20-30 minutes a day, if you can.   As far as your medications are concerned, I would like to suggest: Increase neupro patch to 60m a day (420m+82m582match) daily  I would like to see you back in 6 months, sooner if we need to. Please call us [place] any interim questions, concerns, problems, updates or refill requests.   Our phone number is [phone] also have an after hours call service for urgent matters and there is a physician on-call for urgent questions. For any emergencies you know to call 911 or go to the nearest emergency room

## 2016-02-10 ENCOUNTER — Encounter: Payer: Self-pay | Admitting: Neurology

## 2016-02-22 ENCOUNTER — Telehealth: Payer: Self-pay | Admitting: Neurology

## 2016-02-22 MED ORDER — ROTIGOTINE 6 MG/24HR TD PT24: 1.0000 | MEDICATED_PATCH | Freq: Every day | TRANSDERMAL | 12 refills | Status: DC

## 2016-02-22 NOTE — Telephone Encounter (Signed)
Called wife back. She stated pt ran out of 51m patch to combine with the 468mpatch they already had yesterday to equal 86m69mAdvised I will speak with DanHinton Dyerd see what we have to do to get him the updated dose. Advised I will call back once I figure this out. She verbalized understanding.

## 2016-02-22 NOTE — Telephone Encounter (Signed)
Printed rx neupro for 54m. Awaiting AA,MD signature and will fax to number below.

## 2016-02-22 NOTE — Telephone Encounter (Signed)
Called and spoke to wife. Advised all we have to do is send in new rx for 62m. Advised I am waiting for AA,MD to sign and I will fax. She verbalized understanding.

## 2016-02-22 NOTE — Telephone Encounter (Signed)
Called and spoke to [place] at [facility] for neupro patient assistance. Advised patient already enrolled and does not expire until march. Advised we increased patient's dose to 78m instead of 472m She stated no need to re-enroll patient. All we have to do is fax in new rx for 52m10mo [phone] will process and get this ready for patient. Nothing further needed.

## 2016-02-22 NOTE — Telephone Encounter (Signed)
Faxed prescription to number below. Received confirmation.

## 2016-02-22 NOTE — Telephone Encounter (Signed)
Pt's wife called to advise he is out of neupro 47m and she has no funds for a refill. Are there any resources that could help with this? Please call

## 2016-03-12 NOTE — Telephone Encounter (Signed)
Noted, thank you

## 2016-03-12 NOTE — Telephone Encounter (Signed)
FYI-Patient's wife called back stating she spoke to patient assistance and everything has been taken care of and patches will arrive Thursday. She wanted to thank you for all your help.

## 2016-03-12 NOTE — Telephone Encounter (Signed)
Called and spoke to wife. Advised we sent new rx 49m patch to [facility] patient assistance a couple weeks ago. Gave her number to call and check on this: [phone] Advised her if anything further needed from our office to call back and let me know. She verbalized understanding.

## 2016-03-12 NOTE — Telephone Encounter (Signed)
Pt's wife called about rx. She was advised it had been sent to [facility] [phone]. She said I can't afford it. Pls call to clarify

## 2016-03-28 ENCOUNTER — Telehealth: Payer: Self-pay | Admitting: Neurology

## 2016-03-28 MED ORDER — TRIHEXYPHENIDYL HCL 2 MG PO TABS: 2.0000 mg | ORAL_TABLET | Freq: Every day | ORAL | 11 refills | Status: DC

## 2016-03-28 NOTE — Telephone Encounter (Signed)
That's fine. thanks

## 2016-03-28 NOTE — Telephone Encounter (Signed)
Dr Jaynee Eagles- are you okay with refilling this medication? Does not look like you have filled this before but you mentioned in last note for patient to continue artane. You last saw patient on 02/07/16. Has a f/u with you in May.

## 2016-03-28 NOTE — Addendum Note (Signed)
Addended by: Hope Pigeon on: 03/28/2016 04:56 PM   Modules accepted: Orders

## 2016-03-28 NOTE — Telephone Encounter (Signed)
Sent in rx electronically to pt pharmacy.  Called wife and advised we sent refill. She verbalized understanding.

## 2016-03-28 NOTE — Telephone Encounter (Signed)
Pt's wife called request refill for trihexyphenidyl (ARTANE) 2 MG tablet sent to [facility]. She said pt takes the generic 30m. She is requesting RN to call

## 2016-04-24 ENCOUNTER — Encounter: Payer: Self-pay | Admitting: Neurology

## 2016-04-24 ENCOUNTER — Ambulatory Visit (INDEPENDENT_AMBULATORY_CARE_PROVIDER_SITE_OTHER): Payer: Medicare HMO | Admitting: Neurology

## 2016-04-24 VITALS — BP 127/76 | HR 85 | Resp 20 | Ht 66.0 in | Wt 202.0 lb

## 2016-04-24 DIAGNOSIS — G253 Myoclonus: Secondary | ICD-10-CM

## 2016-04-24 DIAGNOSIS — G2581 Restless legs syndrome: Secondary | ICD-10-CM | POA: Diagnosis not present

## 2016-04-24 DIAGNOSIS — T50905A Adverse effect of unspecified drugs, medicaments and biological substances, initial encounter: Secondary | ICD-10-CM

## 2016-04-24 DIAGNOSIS — G20 Parkinson's disease: Secondary | ICD-10-CM | POA: Diagnosis not present

## 2016-04-24 DIAGNOSIS — G20A1 Parkinson's disease without dyskinesia, without mention of fluctuations: Secondary | ICD-10-CM

## 2016-04-24 DIAGNOSIS — G2589 Other specified extrapyramidal and movement disorders: Secondary | ICD-10-CM

## 2016-04-24 DIAGNOSIS — G2571 Drug induced akathisia: Secondary | ICD-10-CM

## 2016-04-24 MED ORDER — ROPINIROLE HCL 0.5 MG PO TABS: ORAL_TABLET | ORAL | 3 refills | Status: DC

## 2016-04-24 NOTE — Patient Instructions (Signed)
Restless Legs Syndrome Introduction Restless legs syndrome is a condition that causes uncomfortable feelings or sensations in the legs, especially while sitting or lying down. The sensations usually cause an overwhelming urge to move the legs. The arms can also sometimes be affected. The condition can range from mild to severe. The symptoms often interfere with a person's ability to sleep. What are the causes? The cause of this condition is not known. What increases the risk? This condition is more likely to develop in:  People who are older than age 68.  Pregnant women. In general, restless legs syndrome is more common in women than in men.  People who have a family history of the condition.  People who have certain medical conditions, such as iron deficiency, kidney disease, Parkinson disease, or nerve damage.  People who take certain medicines, such as medicines for high blood pressure, nausea, colds, allergies, depression, and some heart conditions. What are the signs or symptoms? The main symptom of this condition is uncomfortable sensations in the legs. These sensations may be:  Described as pulling, tingling, prickling, throbbing, crawling, or burning.  Worse while you are sitting or lying down.  Worse during periods of rest or inactivity.  Worse at night, often interfering with your sleep.  Accompanied by a very strong urge to move your legs.  Temporarily relieved by movement of your legs. The sensations usually affect both sides of the body. The arms can also be affected, but this is rare. People who have this condition often have tiredness during the day because of their lack of sleep at night. How is this diagnosed? This condition may be diagnosed based on your description of the symptoms. You may also have tests, including blood tests, to check for other conditions that may lead to your symptoms. In some cases, you may be asked to spend some time in a sleep lab so your  sleeping can be monitored. How is this treated? Treatment for this condition is focused on managing the symptoms. Treatment may include:  Self-help and lifestyle changes.  Medicines. Follow these instructions at home:  Take medicines only as directed by your health care provider.  Try these methods to get temporary relief from the uncomfortable sensations:  Massage your legs.  Walk or stretch.  Take a cold or hot bath.  Practice good sleep habits. For example, go to bed and get up at the same time every day.  Exercise regularly.  Practice ways of relaxing, such as yoga or meditation.  Avoid caffeine and alcohol.  Do not use any tobacco products, including cigarettes, chewing tobacco, or electronic cigarettes. If you need help quitting, ask your health care provider.  Keep all follow-up visits as directed by your health care provider. This is important. Contact a health care provider if: Your symptoms do not improve with treatment, or they get worse. This information is not intended to replace advice given to you by your health care provider. Make sure you discuss any questions you have with your health care provider. Document Released: 03/01/2002 Document Revised: 08/17/2015 Document Reviewed: 03/07/2014  2017 Elsevier Ropinirole tablets What is this medicine? ROPINIROLE (roe PIN i role) is used to treat the symptoms of Parkinson's disease. It helps to improve muscle control and movement difficulties. It is also used for the treatment of Restless Legs Syndrome. This medicine may be used for other purposes; ask your health care provider or pharmacist if you have questions. COMMON BRAND NAME(S): Requip What should I tell my health  care provider before I take this medicine? They need to know if you have any of these conditions: -dizzy or fainting spells -heart disease -high blood pressure -kidney disease -liver disease -low blood pressure -sleeping problems -an unusual  or allergic reaction to ropinirole, other medicines, foods, dyes, or preservatives -pregnant or trying to get pregnant -breast-feeding How should I use this medicine? Take this medicine by mouth with a glass of water. Follow the directions on the prescription label. You can take it with or without food. If it upsets your stomach, take it with food. Take your doses at regular intervals. Do not take your medicine more often than directed. Do not stop taking this medicine except on your doctor's advice. Stopping this medicine too quickly may cause serious side effects. Talk to your pediatrician regarding the use of this medicine in children. Special care may be needed. Overdosage: If you think you have taken too much of this medicine contact a poison control center or emergency room at once. NOTE: This medicine is only for you. Do not share this medicine with others. What if I miss a dose? If you miss a dose, take it as soon as you can. If it is almost time for your next dose, take only that dose. Do not take double or extra doses. What may interact with this medicine? -ciprofloxacin -male hormones, like estrogens and birth control pills -medicines for depression, anxiety, or psychotic disturbances -metoclopramide -mexiletine -norfloxacin -omeprazole This list may not describe all possible interactions. Give your health care provider a list of all the medicines, herbs, non-prescription drugs, or dietary supplements you use. Also tell them if you smoke, drink alcohol, or use illegal drugs. Some items may interact with your medicine. What should I watch for while using this medicine? Visit your doctor or health care professional for regular checks on your progress. It may be several weeks or months before you feel the full effect of this medicine. You may get drowsy or dizzy. Do not drive, use machinery, or do anything that needs mental alertness until you know how this drug affects you. Do not  stand or sit up quickly, especially if you are an older patient. This reduces the risk of dizzy or fainting spells. Alcohol can increase possible dizziness. Avoid alcoholic drinks. If you find that you have sudden feelings of wanting to sleep during normal activities, like cooking, watching television, or while driving or riding in a car, you should contact your health care professional. Your mouth may get dry. Chewing sugarless gum or sucking hard candy, and drinking plenty of water may help. Contact your doctor if the problem does not go away or is severe. There have been reports of increased sexual urges or other strong urges such as gambling while taking some medicines for Parkinson's disease. If you experience any of these urges while taking this medicine, you should report it to your health care provider as soon as possible. You should check your skin often for changes to moles and new growths while taking this medicine. Call your doctor if you notice any of these changes. What side effects may I notice from receiving this medicine? Side effects that you should report to your doctor or health care professional as soon as possible: -allergic reactions like skin rash, itching or hives, swelling of the face, lips, or tongue -changes in vision -chest pain -confusion -falling asleep during normal activities like driving -fast, irregular heartbeat -feeling faint or lightheaded, falls -hallucination, loss of contact with reality -  joint or muscle pain -loss of bladder control -loss of memory -new or increased gambling urges, sexual urges, uncontrolled spending, binge or compulsive eating, or other urges -pain, tingling, numbness in the hands or feet -shortness of breath, troubled breathing, tightness in chest, or wheezing -signs and symptoms of low blood pressure like dizziness; feeling faint or lightheaded, falls; unusually weak or tired -swelling of the ankles, feet, hands -uncontrollable head,  mouth, neck, arm, or leg movements -vomiting Side effects that usually do not require medical attention (report to your doctor or health care professional if they continue or are bothersome): -dizziness -drowsiness -headache -increased sweating -nausea -tremors This list may not describe all possible side effects. Call your doctor for medical advice about side effects. You may report side effects to FDA at 1-800-FDA-1088. Where should I keep my medicine? Keep out of the reach of children. Store at room temperature between 20 and 25 degrees C (68 and 77 degrees F). Protect from light and moisture. Keep container tightly closed. Throw away any unused medicine after the expiration date. NOTE: This sheet is a summary. It may not cover all possible information. If you have questions about this medicine, talk to your doctor, pharmacist, or health care provider.  2017 Elsevier/Gold Standard (2015-08-28 10:58:05)

## 2016-04-24 NOTE — Progress Notes (Signed)
[facility]   Provider:  Larey Bennett, M D  Referring Provider: Katherina Mires, MD Primary Care Physician:  Luke Obey, MD  Chief Complaint  Patient presents with  . Follow-up    RLS getting worse    HPI:  04-24-2016.  Luke Bennett is a 71 y.o. male , seen here in a RV( revisit)  from Dr. Jaynee Bennett, who has diagnosed this patient with PD by DAT scan, and has treated him accordingly. He is also taking  Seroquel . 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  I have seen him before and he wanted to see me again for the specific RLS concern.  The patient reports that his legs are jumping, and that it gets around bedtime worse than throughout the day. This is a twitching, kicking more than shaking. If an irresistible urge to move that he cannot suppress. He kindly onset questions of the [employer] restless leg syndrome quality of life questionnaire; he feels that there is an increasing level of distress about the restless legs interfering with his nocturnal sleep but he states that his routines are not interrupted, and that he does not feel different in the morning and rising from sleep that he doesn't have to reschedule appointments for morning hours, that he only a few times feels that he may be tired or fatigued in the afternoon or evening. As very severe that he rate his experience over the last 7 days. Very severe trouble to fall asleep very severe trouble to stay asleep related to uncontrollable leg movements, described as myoclonic, periodic and rhythmic - usually affecting the left or right leg, but rarely bilaterally . His wife describes that he often has to walk around at night, and that his sleep has been significantly fragmented.  Sleep habits are as follows: He is usually in bed by 10 but his restless legs make it difficult to fall asleep. By 10:30 he tries to be asleep, most nights he  is. But he will not sleep through the night. He wakes up every 2 hours usually separated by a bathroom break, but by the second bathroom trip he has trouble reinitiating sleep. I now he has every night some trouble to continue sleeping in the early morning hours. His overall sleep time has been reduced to about 6 hours but he is easily spends 10 hours in bed. He has leg cramps.     Review of Systems: Out of a complete 14 system review, the patient complains of only the following symptoms, and all other reviewed systems are negative. RLS questionnaires are attached.    Social History   Social History  . Marital status: Married    Spouse name: N/A  . Number of children: N/A  . Years of education: N/A   Occupational History  . Retired    Social History Main Topics  . Smoking status: Former Research scientist (life sciences)  . Smokeless tobacco: Never Used  . Alcohol use No  . Drug use: No  . Sexual activity: Not on file   Other Topics Concern  . Not on file   Social History Narrative   Lives at home with wife and in-laws.   Caffeine use: none       Family History  Problem Relation Age of Onset  . Hypertension Sister   . Hodgkin's lymphoma Sister   . Cerebral aneurysm Father   . Parkinsonism Neg Hx  Past Medical History:  Diagnosis Date  . Anxiety   . Crohn's disease ([place])   . Depression   . Fatty liver   . Heart attack   . TIA (transient ischemic attack)     Past Surgical History:  Procedure Laterality Date  . ABDOMINAL SURGERY    . COLECTOMY    . OSTOMY    . STOMA DILATATION    . TONSILLECTOMY      Current Outpatient Prescriptions  Medication Sig Dispense Refill  . adalimumab (HUMIRA PEN) 40 MG/0.8ML injection Inject 40 mg into the skin every 14 (fourteen) days. Due Monday     12/07/12    . amantadine (SYMMETREL) 100 MG capsule Start with one 133m tablet daily for 3 days then increase to one 1077mtablet twice a day 60 capsule 12  . calcium carbonate (OS-CAL) 600 MG TABS  tablet Take 600 mg by mouth daily with breakfast.     . Multiple Vitamin (MULTIVITAMIN WITH MINERALS) TABS tablet Take 1 tablet by mouth daily.    . QUEtiapine (SEROQUEL) 300 MG tablet Take 300 mg by mouth daily.  4  . rOPINIRole (REQUIP) 0.25 MG tablet 3 at bedtime. 240 tablet 3  . rotigotine (NEUPRO) 6 MG/24HR Place 1 patch onto the skin daily. 30 patch 12  . trihexyphenidyl (ARTANE) 2 MG tablet Take 1 tablet (2 mg total) by mouth daily. At 7pm 30 tablet 11   No current facility-administered medications for this visit.     Allergies as of 04/24/2016 - Review Complete 04/24/2016  Allergen Reaction Noted  . Aspirin  12/04/2012  . Celecoxib  12/04/2012  . Sinemet [carbidopa-levodopa] Rash 03/01/2015    Vitals: BP 127/76   Pulse 85   Resp 20   Ht 5' 6"  (1.676 m)   Wt 202 lb (91.6 kg)   BMI 32.60 kg/m  Last Weight:  Wt Readings from Last 1 Encounters:  04/24/16 202 lb (91.6 kg)   BMVWP:VXYIass index is 32.6 kg/m.     Last Height:   Ht Readings from Last 1 Encounters:  04/24/16 5' 6"  (1.676 m)    Physical exam:  General: The patient is awake, alert and appears not in acute distress. The patient is well groomed. Head: Normocephalic, atraumatic. Neck is supple. Masked face is noted. Cardiovascular:  Regular rate and rhythm, without  murmurs or carotid bruit, and without distended neck veins. Respiratory: Lungs are clear to auscultation. Skin:  Without evidence of edema, or rash Trunk: BMI is elevated   Neurologic exam : The patient is awake and alert, oriented to place and time.   Mood and affect are appropriate.  Cranial nerves: Pupils are equal and briskly reactive to light. Visual fields by finger perimetry are intact.  Hearing to finger rub intact. Facial sensation intact to fine touch. Facial motor strength is symmetric and tongue and uvula move midline. Shoulder shrug was symmetrical.   Motor exam:   Increased  tone,  Rigor and mild cog-wheeling. -normal muscle bulk  and symmetric strength . His grip strength is lesser, he has to strain and develops a tremor while holding on, not so much at rest. .  Coordination: Rapid alternating movements in the fingers/hands is slowed- left , right hand dominant .  Finger-to-nose maneuver  normal without evidence of ataxia, dysmetria or tremor.  Gait and station: Patient walks without assistive device and is able unassisted to climb up to the exam table. Strength within normal limits.  Stance is stable and normal.  Toe and heel stand were tested . Deep tendon reflexes: in the  upper and lower extremities are symmetric and intact.   The patient was advised of the nature of the diagnosed RLS disorder , the treatment options and risks for general a health and wellness arising from not treating the condition.  I spent more than 30 minutes of face to face time with the patient. Greater than 50% of time was spent in counseling and coordination of care. We have discussed the diagnosis and differential and I answered the patient's questions.     Assessment:  Mr. has has been diagnosed with Parkinson's disease, confirmed by a DAT scan. I have the suspicion that also Seroquel which is used to treat his underlying anxiety and psychological disorder has been contributing somewhat to his restless leg symptoms, myoclonic jerks tremor and rigidity. It has helped him however to have more normal sleep rhythm. In addition he takes Artane, amantadine, Neupro patches and Requip. Be noted witnessed has helped but he has reached a very high daily dose with a 6 mg Neupro patch in addition to oral Requip. I do not think that there is that much more room to go. I would like him to stay on the Neupro patch which has not given him side effects.  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   Plan:  Treatment plan and additional workup :  I will keep Mr. has on Neupro patches, I will increase the Requip  immediate release for intervention throughout the night. He may take up to 2 Requip pills as necessary at night. He understand that Requip may give him excessive daytime sleepiness and micro-sleep attacks.  He is not diabetic, he does not have a history of neuropathy.    Asencion Partridge Leilene Diprima MD  04/24/2016   CC: Luke Mires, Md [place], [place] [id number]

## 2016-05-16 ENCOUNTER — Telehealth: Payer: Self-pay | Admitting: *Deleted

## 2016-05-16 NOTE — Telephone Encounter (Signed)
Started PA for medication on cover my meds.

## 2016-05-17 NOTE — Telephone Encounter (Signed)
Received fax from [facility]. Trihexyphenidyl PA approved through 03/24/17.

## 2016-05-17 NOTE — Telephone Encounter (Signed)
PA approved per cover my meds, awaiting fax with details on PA.

## 2016-06-05 ENCOUNTER — Telehealth: Payer: Self-pay | Admitting: Neurology

## 2016-06-05 NOTE — Telephone Encounter (Signed)
Patients wife Meshulem Onorato called office in reference to rotigotine (NEUPRO) 6 MG/24HR.  Patient is going to be using patient assistant for medication.  Paperwork is needing to be filled out and faxed along with Rx.  Patients wife is going to complete the paperwork and drop it by the office tomorrow to be signed.  Please call

## 2016-06-12 NOTE — Telephone Encounter (Signed)
Pt's wife called and spoke to phone room to follow-up on pt assistance for Neupro patches. Said that there is a form that Dr. Jaynee Eagles has to fill out and a new rx needs to be faxed in. However, Hinton Dyer, who usually does patient assistance, is out of the office today.

## 2016-06-12 NOTE — Telephone Encounter (Signed)
I don't have it! When is Hinton Dyer coming back? I'm sure she wouldn't mind [place] calling her at home to see if she knows where it is.Marland KitchenMarland Kitchen

## 2016-06-14 ENCOUNTER — Other Ambulatory Visit: Payer: Self-pay

## 2016-06-14 MED ORDER — ROTIGOTINE 6 MG/24HR TD PT24: 1.0000 | MEDICATED_PATCH | Freq: Every day | TRANSDERMAL | 11 refills | Status: DC

## 2016-06-14 NOTE — Telephone Encounter (Signed)
Called and spoke to pt's wife who voiced concern about pt running out of patches before pt assistance was approved. Says that he has plenty of the 4 mg patches but only 5 of the 2 mg. Plans on using up the 2 mg (along w/ the 4 mg) in hopes that pt assistance will be approved next week. If not, will use 4 mg patches until the 6 mg arrive.

## 2016-06-14 NOTE — Telephone Encounter (Signed)
Patient assistance is processing To [facility] For Neupro mg /24 hr. Sent as urgent  Patch . Telephone [phone]- fax [phone].   Dr. Jaynee Eagles Patient's wife would like for you to call her. I relayed to Patient's wife you were seeing patient's. Patient's wife  would not tell me why only wanted to Dr. Jaynee Eagles.

## 2016-06-14 NOTE — Telephone Encounter (Signed)
Delsa Sale can you call wife back today please? thanks

## 2016-06-19 NOTE — Telephone Encounter (Signed)
Pt wife called and wanted to thank Truett Perna and Dr Jaynee Eagles for everything, her husband is going to get his patches on tomorrow.  She appreciates all the hard work that was put into getting the matter resolved.

## 2016-06-19 NOTE — Telephone Encounter (Signed)
It was all dana and jen, they are fabulous.

## 2016-08-07 ENCOUNTER — Encounter (INDEPENDENT_AMBULATORY_CARE_PROVIDER_SITE_OTHER): Payer: Self-pay

## 2016-08-07 ENCOUNTER — Ambulatory Visit (INDEPENDENT_AMBULATORY_CARE_PROVIDER_SITE_OTHER): Payer: Medicare HMO | Admitting: Neurology

## 2016-08-07 ENCOUNTER — Encounter: Payer: Self-pay | Admitting: Neurology

## 2016-08-07 VITALS — BP 135/85 | HR 78 | Ht 66.0 in | Wt 203.8 lb

## 2016-08-07 DIAGNOSIS — G20 Parkinson's disease: Secondary | ICD-10-CM

## 2016-08-07 MED ORDER — ROTIGOTINE 8 MG/24HR TD PT24: 8.0000 mg | MEDICATED_PATCH | Freq: Every day | TRANSDERMAL | 11 refills | Status: DC

## 2016-08-07 NOTE — Patient Instructions (Signed)
Rotigotine transdermal skin patch What is this medicine? ROTIGOTINE (roe TIG oh teen) is used to control the signs and symptoms of Parkinson's disease or restless legs syndrome. This medicine may be used for other purposes; ask your health care provider or pharmacist if you have questions. COMMON BRAND NAME(S): Neupro What should I tell my health care provider before I take this medicine? They need to know if you have any of these conditions: -heart disease -high blood pressure -lung or breathing disease, like asthma -mental illness -skin cancer -sleep disorder -an unusual or allergic reaction to rotigotine, sulfites, other medicines, foods, dyes, or preservatives -pregnant or trying to get pregnant -breast-feeding How should I use this medicine? This medicine is for external use only. Follow the directions on the prescription label. Use exactly as directed. Wash hands after removing and applying this medicine. Change the patch each day at the same time. Apply the patch to an area of the upper arm or body that is clean, dry, and hairless. Do not use this patch on skin that is injured, irritated, oily, or calloused. Do not apply where the patch will be rubbed by tight clothing or a waistband. Do not apply to the same place more than once every 14 days in order to prevent skin irritation. Do not cut or trim the patch. Take your medicine at regular intervals. Do not take it more often than directed. Do not stop taking except on your doctor's advice. Always remove the old patch before you apply a new one. Remove patch slowly and carefully to avoid irritation. After removal, fold the patch so that it sticks to itself and throw it away. After removal of patch, wash the area with soap and water to remove any drug or adhesive. Baby oil or mineral oil may be used if needed. Do not use alcohol or other liquids. Talk to your pediatrician regarding the use of this medicine in children. Special care may be  needed. Overdosage: If you think you have taken too much of this medicine contact a poison control center or emergency room at once. NOTE: This medicine is only for you. Do not share this medicine with others. What if I miss a dose? If you miss a dose, take it as soon as you can. If it is almost time for your next dose, take only that dose. Do not take double or extra doses. What may interact with this medicine? -alcohol -antihistamines for allergy, cough and cold -certain medicines for sleep -medicines for depression, anxiety, or psychotic disturbances -metoclopramide -narcotic medicines for pain This list may not describe all possible interactions. Give your health care provider a list of all the medicines, herbs, non-prescription drugs, or dietary supplements you use. Also tell them if you smoke, drink alcohol, or use illegal drugs. Some items may interact with your medicine. What should I watch for while using this medicine? Visit your doctor for regular check ups. Tell your doctor or healthcare professional if your symptoms do not start to get better or if they get worse. You may get drowsy or dizzy. Do not drive, use machinery, or do anything that needs mental alertness until you know how this medicine affects you. Do not stand or sit up quickly, especially if you are an older patient. This reduces the risk of dizzy or fainting spells. Alcohol may interfere with the effect of this medicine. Avoid alcoholic drinks. If you find that you have sudden feelings of wanting to sleep during normal activities, like cooking, watching  television, or while driving or riding in a car, you should contact your health care professional. There have been reports of increased sexual urges or other strong urges such as gambling while taking this medicine. If you experience any of these while taking this medicine, you should report this to your health care provider as soon as possible. This medicine patch is  sensitive to certain body heat changes. If your skin gets too hot, more medicine will come out of the patch. Call your healthcare provider if you get a fever. Do not take hot baths. Do not sunbathe. Do not use hot tubs, saunas, hair dryers, heating pads, electric blankets, heated waterbeds, or tanning lamps. Do not do exercise that increases your body temperature. If you are going to have a magnetic resonance imaging (MRI) procedure, tell your MRI technician if you have this patch on your body. It must be removed before a MRI. What side effects may I notice from receiving this medicine? Side effects that you should report to your doctor or health care professional as soon as possible: -allergic reactions like skin rash, itching or hives, swelling of the face, lips, or tongue -anxiety, restlessness -breathing problems -confusion -dizziness -falling asleep during normal activities like driving -fast, irregular or slow heartbeat -feeling faint or lightheaded, falls -hallucination, loss of contact with reality -skin irritation, redness, or swelling -uncontrollable head, mouth, neck, arm, or leg movements -uncontrollable and excessive urges (examples: gambling, binge eating, shopping, having sex) Side effects that usually do not require medical attention (report to your doctor or health care professional if they continue or are bothersome): -constipation -difficulty sleeping -headache -loss of appetite -nausea, vomiting -stomach pain -weight gain This list may not describe all possible side effects. Call your doctor for medical advice about side effects. You may report side effects to FDA at 1-800-FDA-1088. Where should I keep my medicine? Keep out of the reach of children. Store at room temperature between 15 and 30 degrees C (59 and 86 degrees F). Keep container tightly closed. Store in original pouch until just before use. Throw away any unused medicine after the expiration date. NOTE: This  sheet is a summary. It may not cover all possible information. If you have questions about this medicine, talk to your doctor, pharmacist, or health care provider.  2018 Elsevier/Gold Standard (2015-10-20 15:25:23)

## 2016-08-07 NOTE — Progress Notes (Signed)
[facility]    Provider:  Dr Jaynee Eagles Referring Provider: Katherina Mires, MD Primary Care Physician:  Katherina Mires, MD  [facility]   Provider:  Dr Jaynee Eagles Referring Provider: Katherina Mires, MD Primary Care Physician:  Suzanna Obey, MD   CC: Parkinson's disease  Interval history: He is more confused. Has a hard time understanding things. Forgetting things he does every day. Walking slower. No hallucinations. No swallowing difficulties, no falls. Already had a sleep study.   Interval history: His tremor is doing well. He is slowing down. No new symptoms. He is on Artane. Will try 83m neupro patch. Discussed options, decided to increase patch, if he does well call for new prescription.   Interval history 08/01/2015: Patient is doing very well and Neupro patch and his tremor is much improved. He still has a very slight right hand resting tremor. He is doing much better on the neupro patch. Patient is slowing down. He feels quite fatigued. Feels as though he is walking slower. Discussed this today. We'll hold off on increasing the Neupro patch. Try amantadine as this is use for fatigue and that is also used a tremor and Parkinson's. Discussed side effects.  Interval history 05/01/2015: Patient and wife come into the office today to review DAT scan. DAT scan consistent with Parkinson's disease. Patient has been on Seroquel 300 mg and has a long psychiatric history for which multiple medications have been tried. It is not possible for him to stop or decrease the Seroquel.   The potentially interfering drugs consist of: amoxapine, amphetamine, benztropine, bupropion, buspirone, citalopram, cocaine, mazindol, methamphetamine, methylphenidate, norephedrine, phentermine, escitalopram, phenylpropanolamine, selegiline, paroxetine, and sertraline of which patient is not taking. Patient could not stop the Seroquel and this drug likely will not interfere wtth  the results. .[name] HPI: Luke Bennett a 71y.o. male here as a referral from Dr. BDoreene Nestfor tremor. He has a PMHx of Schizoaffective disorder on Seroquel, depression, anxiety, TIA, MI, Crohn's Dz, thrombocytopenia, CKD, glucose intolerance. He is here with his wife who provides information. He acts out his dreams, she almost got clobbered the other night, hit her pillow. His resting tremor continues, feels it is worsening. Discussed MRI of the brain which was normal. Discussed his previous history of dopamine-blocking agents and his parkinsonism. Can try Sinemet and see how he responds, warned that it can cause confusion, hallucinations which is worrisome given patient's past medical history. He is on Seroquel currently. I have asked him to follow with psychiatry as well.   Previous history:He has a worsening tremor, started a few months ago. Also noticing that his right shoulder is lower than the other and holds his hand on his stomach when he walks possibly to try and keep it from trembling. He is having a harder time remembering things. Memory is less than they used to be. Walking is slower but steady. He gets tired easily. No falls. He has had several episdes at night with feelings like he was going to collapse. He is changing a lot, declining. He will forget things in 15 minutes, wife will tell him something and he forgets, has to be reminded. He forgets things he does every day, like setting the table - forgets things that go on the table. He is getitng easily confused by tasks. Wife pays the bills. He does not drive. He is on Humira for Crohn's disease. They notice the tremor all the time. Mostly with resting or walking, sitting  or eating, less with use. Doesn't happen when sleeping. Smell is fine. No constipation. No problems swallowing. Handwriting is getting poorer, shaky and smaller. No other focal neurologic symptoms. Denies hypophonia, shuffling. No other focal neurologic deficits.    Reviewed notes, labs and imaging from outside physicians, which showed: Notes from Dr. Suzanna Obey also document that patient's tremor has been worsening, right hand only, at rest. Does not affect ADLs. No gait instability. She was also concern for idiopathic Parkinson's disease versus TIA or stroke. He is on Humira for Crohn's disease. She also noted right greater than left cogwheeling on exam.  Review of Systems: Patient complains of symptoms per HPI as well as the following symptoms: tremor. No CP, no SOB. Pertinent negatives per HPI. All others negative.   Social History   Social History  . Marital status: Married    Spouse name: N/A  . Number of children: 4  . Years of education: N/A   Occupational History  . Retired    Social History Main Topics  . Smoking status: Former Research scientist (life sciences)  . Smokeless tobacco: Never Used  . Alcohol use No  . Drug use: No  . Sexual activity: Not on file   Other Topics Concern  . Not on file   Social History Narrative   Lives at home with wife and in-laws.   Caffeine use: none       Family History  Problem Relation Age of Onset  . Cerebral aneurysm Father   . Hypertension Sister   . Hodgkin's lymphoma Sister   . Parkinsonism Neg Hx     Past Medical History:  Diagnosis Date  . Anxiety   . Crohn's disease ([place])   . Depression   . Fatty liver   . Heart attack ([place])   . TIA (transient ischemic attack)     Past Surgical History:  Procedure Laterality Date  . ABDOMINAL SURGERY    . COLECTOMY    . OSTOMY    . STOMA DILATATION    . TONSILLECTOMY      Current Outpatient Prescriptions  Medication Sig Dispense Refill  . adalimumab (HUMIRA PEN) 40 MG/0.8ML injection Inject 40 mg into the skin every 14 (fourteen) days. Due Monday     12/07/12    . calcium carbonate (OS-CAL) 600 MG TABS tablet Take 600 mg by mouth daily with breakfast.     . Multiple Vitamin (MULTIVITAMIN WITH MINERALS) TABS tablet Take 1 tablet by mouth daily.    .  QUEtiapine (SEROQUEL) 300 MG tablet Take 300 mg by mouth daily.  4  . rOPINIRole (REQUIP) 0.5 MG tablet Up to 2 tabs. Take up to 2  when waking up from RLS. 180 tablet 3  . Rotigotine (NEUPRO) 8 MG/24HR PT24 Place 8 mg onto the skin daily. 30 patch 11   No current facility-administered medications for this visit.     Allergies as of 08/07/2016 - Review Complete 08/07/2016  Allergen Reaction Noted  . Aspirin  12/04/2012  . Celecoxib  12/04/2012  . Sinemet [carbidopa-levodopa] Rash 03/01/2015    Vitals: BP 135/85   Pulse 78   Ht 5' 6"  (1.676 m)   Wt 203 lb 12.8 oz (92.4 kg)   BMI 32.89 kg/m  Last Weight:  Wt Readings from Last 1 Encounters:  08/07/16 203 lb 12.8 oz (92.4 kg)   Last Height:   Ht Readings from Last 1 Encounters:  08/07/16 5' 6"  (1.676 m)    Neuro: Detailed Neurologic Exam  Speech:  Speech is normal; fluent and spontaneous with normal comprehension.  Cognition:  The patient is oriented to person, place, and time;   Cranial Nerves:Hypomimia  The pupils are equal, round, and reactive to light. The fundi are flat. Visual fields are full to finger confrontation. Impaired upgaze, otherwise extraocular movements are intact. Trigeminal sensation is intact and the muscles of mastication are normal. The face is symmetric. The palate elevates in the midline. Hearing intact. Voice is normal. Shoulder shrug is normal. The tongue has normal motion without fasciculations.   Coordination:  Right arm bradykinesia compared to left  Gait:  Decreased arm right swing and re-emergent right hand tremor. Improved  Motor Observation:  Very minimal (improved) right resting tremor with some postural components Tone:  Cogwheeling upper right extremity with facilitation. Much improved.   Posture:  Slightly stooped.   Strength:  Strength is V/V in the upper and lower limbs.    Sensation: intact to LT    Assessment/Plan: 71 y.o. male  here as a referral from Dr. Doreene Nest for resting tremor. He has a PMHx of Schizoaffective disorder on Seroquel, depression, anxiety, TIA, MI, Crohn's Dz, thrombocytopenia, CKD, glucose intolerance who is here for eval of tremor and memory changes. Neuro exam significant for resting tremor with postural components, right arm cogwheeling with facilitation, decreased right arm swing on walking with emergent tremor, decreased blink reflex, right hand bradykinesia. Consistent with parkinsonism. Patient is on dopamine blocking agent, unclear if this is secondary to medication effect or if he is developing idiopathic PD, other neurodegenerative disorder. However seroquel is the least likely of the atypical antidepressants to cause extrapyramidal side effects. MRi of the brain normal.  DAT scan was performed to try and evaluate for the different causes of parkinsonism, c/w Parkinson's disease (markedly abnormal DAT scan with significantly decreased tracer on the bilateral putamen and left caudate. This pattern of activity can be seen with Parkinson's disease or related syndromes.). He has responded well to Neupro patch. Will continue 26m.  Tremor Improved, feeling more fatigued however and still with a very minimal resting right tremor. Discussed options.  Will increase neupro to 870mpatch.    AnSarina IllMD  [facility] [id number]u[place] [id number] [phone] total of 15 minutes was spent face-to-face with this patient. Over half this time was spent on counseling patient on the Parkinson's Disease  diagnosis and different diagnostic and therapeutic options available.

## 2016-09-02 ENCOUNTER — Telehealth: Payer: Self-pay | Admitting: Neurology

## 2016-09-02 NOTE — Telephone Encounter (Signed)
Patient's wife is calling regarding the patients medication Rotigotine (NEUPRO) 8 MG/24HR PT24. His tremors are getting worse.

## 2016-09-02 NOTE — Telephone Encounter (Signed)
That is the max dose of neupro I would have to start him on a second medication. How much worse are they? If the tremors are manageable then I would hold for now on this dose. Otherwise he could come in and see Jinny Blossom and we could start a second medication but have to discuss with him. thanks

## 2016-09-02 NOTE — Telephone Encounter (Signed)
Pt was last seen 08/12/16 for PD, c/o tremor. Neupro patch was increased to 8 mg daily at that time. Wife called back today to report worsening tremor despite use of higher dose patch.

## 2016-09-03 NOTE — Telephone Encounter (Signed)
Luke Bennett, I asked patient to decrease Neupro to 42m and restart the Artane and call [place] at the end of the week if he is still confused. thanks

## 2016-09-03 NOTE — Telephone Encounter (Signed)
Returned TC to pt's wife. She feels that both pt's tremors and his confusion have worsened since his OV w/ Dr. Jaynee Eagles last month. Both Artane and Symmetrel were d/c'd and pt's Neupro patch was increased to 8 mg. Wife is asking if he should restart one or both PO medications and/or go back to 6 mg Neupro patches. He has an appt scheduled w/ Dr. Brett Fairy for sleep consult next month.

## 2016-09-03 NOTE — Telephone Encounter (Signed)
Chart reviewed patient has complicated history, schizoaffective disorder, parkinsonianism, positive DAT scan consistent with dopamine deficiency.  He is currently on seroquel 300 mg daily, per record, Neupro 8 mg every 24 hours, also Requip 0.5 milligrams for restless leg,  May stop Requip, if he needs medication for his restless leg, will consider gabapentin 100 mg 1-3 tablets every night,  If he is not on Requip, did better with lower dose of Neupro, we can change him to 6 mg every 24 hours,

## 2016-09-03 NOTE — Telephone Encounter (Signed)
Patient's wife calling to discuss worsening of patient's tremors. I offered an appointment with Megan per Dr. Jaynee Eagles in previous message but wife would like a call back.

## 2016-09-24 ENCOUNTER — Telehealth: Payer: Self-pay | Admitting: Neurology

## 2016-09-24 MED ORDER — TRIHEXYPHENIDYL HCL 2 MG PO TABS: 2.0000 mg | ORAL_TABLET | Freq: Every evening | ORAL | 11 refills | Status: DC

## 2016-09-24 NOTE — Telephone Encounter (Signed)
Pt wife calling [id number]  She said that Dr Jaynee Eagles took pt off this medication for a while but started him back.  When she tried to fill it again she was told by the pharmacy( [facility], [place]. [phone] (Phone) [phone] (Fax)   ) that a new prescription is needed.  Please call

## 2016-09-24 NOTE — Telephone Encounter (Signed)
Refills e-scribed to pt's pharmacy.

## 2016-09-24 NOTE — Addendum Note (Signed)
Addended by: Monte Fantasia on: 09/24/2016 02:32 PM   Modules accepted: Orders

## 2016-10-21 ENCOUNTER — Telehealth: Payer: Self-pay | Admitting: Neurology

## 2016-10-21 NOTE — Telephone Encounter (Signed)
Pt's wife called the office said he is still having problems with restless leg. Please call

## 2016-10-21 NOTE — Telephone Encounter (Signed)
Pt last seen in May for PD. He continues on Neupro patch and Artane.

## 2016-10-22 ENCOUNTER — Ambulatory Visit: Payer: Medicare HMO | Admitting: Neurology

## 2016-10-22 NOTE — Telephone Encounter (Signed)
Returned call and spoke to pt's wife who reports that pt has been doing well except for severe restless legs during the night. He continues on the 6 mg Neupro patch, Artane in the evening and Requip as needed. Wife asks if she could give pt 2 tabs of ropinirole at a time. Let her know that would be fine and that dose can be repeated (only once) if needed. She verbalized understanding and agreed to call back in a few days w/ update. Discussed w/ Dr. Jaynee Eagles who recommended checking labs such as ferritin level if RLS symptoms do not improve w/ medication. Pt's sleep eval was previously r/s'd to Nov w/ Dr. Brett Fairy.

## 2016-10-22 NOTE — Telephone Encounter (Signed)
We can go back to the requip at night, that seemed to work for him in the past. Please discuss. Also ask him how he is doing, did his confusion improve? How is histremor doing? thanks

## 2016-10-23 NOTE — Telephone Encounter (Signed)
error 

## 2017-01-27 ENCOUNTER — Ambulatory Visit: Payer: Medicare HMO | Admitting: Neurology

## 2017-02-11 ENCOUNTER — Ambulatory Visit: Payer: Medicare HMO | Admitting: Neurology

## 2017-02-11 ENCOUNTER — Encounter (INDEPENDENT_AMBULATORY_CARE_PROVIDER_SITE_OTHER): Payer: Self-pay

## 2017-02-11 ENCOUNTER — Encounter: Payer: Self-pay | Admitting: Neurology

## 2017-02-11 VITALS — BP 123/78 | HR 96 | Ht 66.0 in | Wt 218.6 lb

## 2017-02-11 DIAGNOSIS — T50905A Adverse effect of unspecified drugs, medicaments and biological substances, initial encounter: Secondary | ICD-10-CM

## 2017-02-11 DIAGNOSIS — G20 Parkinson's disease: Secondary | ICD-10-CM | POA: Diagnosis not present

## 2017-02-11 DIAGNOSIS — G2581 Restless legs syndrome: Secondary | ICD-10-CM

## 2017-02-11 DIAGNOSIS — E611 Iron deficiency: Secondary | ICD-10-CM | POA: Diagnosis not present

## 2017-02-11 DIAGNOSIS — G2571 Drug induced akathisia: Secondary | ICD-10-CM | POA: Diagnosis not present

## 2017-02-11 DIAGNOSIS — G253 Myoclonus: Secondary | ICD-10-CM | POA: Diagnosis not present

## 2017-02-11 MED ORDER — ROPINIROLE HCL 0.5 MG PO TABS: ORAL_TABLET | ORAL | 6 refills | Status: DC

## 2017-02-11 MED ORDER — ROTIGOTINE 6 MG/24HR TD PT24: 1.0000 | MEDICATED_PATCH | Freq: Every day | TRANSDERMAL | 12 refills | Status: DC

## 2017-02-11 NOTE — Progress Notes (Signed)
[facility]    Provider:  Dr Jaynee Eagles Referring Provider: Katherina Mires, MD Primary Care Physician:  Katherina Mires, MD   CC: Parkinson's disease  Interval history: He was getting confused so we decreased Neupro to 84m and he is doing great. His RLS is worsening, Ropinerole is not helping anymore.   He is on 617mneupro Artane - is it helping? May consider stopping it ropinerole at night - increase however watch for confusion, augmentation, check labs today   Interval history: He is more confused. Has a hard time understanding things. Forgetting things he does every day. Walking slower. No hallucinations. No swallowing difficulties, no falls. Already had a sleep study.   Interval history: His tremor is doing well. He is slowing down. No new symptoms. He is on Artane. Will try [id number] patch. Discussed options, decided to increase patch, if he does well call for new prescription.   Interval history 08/01/2015: Patient is doing very well and Neupro patch and his tremor is much improved. He still has a very slight right hand resting tremor. He is doing much better on the neupro patch. Patient is slowing down. He feels quite fatigued. Feels as though he is walking slower. Discussed this today. We'll hold off on increasing the Neupro patch. Try amantadine as this is use for fatigue and that is also used a tremor and Parkinson's. Discussed side effects.  Interval history 05/01/2015: Patient and wife come into the office today to review DAT scan. DAT scan consistent with Parkinson's disease. Patient has been on Seroquel 300 mg and has a long psychiatric history for which multiple medications have been tried. It is not possible for him to stop or decrease the Seroquel.   The potentially interfering drugs consist of: amoxapine, amphetamine, benztropine, bupropion, buspirone, citalopram, cocaine, mazindol, methamphetamine, methylphenidate, norephedrine, phentermine,  escitalopram, phenylpropanolamine, selegiline, paroxetine, and sertraline of which patient is not taking. Patient could not stop the Seroquel and this drug likely will not interfere wtth the results. .  Marland KitchenPI: WilTore Carreker a 71 28o. male here as a referral from Dr. BriDoreene Nestr tremor. He has a PMHx of Schizoaffective disorder on Seroquel, depression, anxiety, TIA, MI, Crohn's Dz, thrombocytopenia, CKD, glucose intolerance. He is here with his wife who provides information. He acts out his dreams, she almost got clobbered the other night, hit her pillow. His resting tremor continues, feels it is worsening. Discussed MRI of the brain which was normal. Discussed his previous history of dopamine-blocking agents and his parkinsonism. Can try Sinemet and see how he responds, warned that it can cause confusion, hallucinations which is worrisome given patient's past medical history. He is on Seroquel currently. I have asked him to follow with psychiatry as well.   Previous history:He has a worsening tremor, started a few months ago. Also noticing that his right shoulder is lower than the other and holds his hand on his stomach when he walks possibly to try and keep it from trembling. He is having a harder time remembering things. Memory is less than they used to be. Walking is slower but steady. He gets tired easily. No falls. He has had several episdes at night with feelings like he was going to collapse. He is changing a lot, declining. He will forget things in 15 minutes, wife will tell him something and he forgets, has to be reminded. He forgets things he does every day, like setting the table - forgets things that go on the table. He  is getitng easily confused by tasks. Wife pays the bills. He does not drive. He is on Humira for Crohn's disease. They notice the tremor all the time. Mostly with resting or walking, sitting or eating, less with use. Doesn't happen when sleeping. Smell is fine. No constipation.  No problems swallowing. Handwriting is getting poorer, shaky and smaller. No other focal neurologic symptoms. Denies hypophonia, shuffling. No other focal neurologic deficits.   Reviewed notes, labs and imaging from outside physicians, which showed: Notes from Dr. Suzanna Obey also document that patient's tremor has been worsening, right hand only, at rest. Does not affect ADLs. No gait instability. She was also concern for idiopathic Parkinson's disease versus TIA or stroke. He is on Humira for Crohn's disease. She also noted right greater than left cogwheeling on exam.  Review of Systems: Patient complains of symptoms per HPI as well as the following symptoms: tremor. No CP, no SOB. Pertinent negatives per HPI. All others negative.  Social History   Socioeconomic History  . Marital status: Married    Spouse name: Not on file  . Number of children: 4  . Years of education: Not on file  . Highest education level: Not on file  Social Needs  . Financial resource strain: Not on file  . Food insecurity - worry: Not on file  . Food insecurity - inability: Not on file  . Transportation needs - medical: Not on file  . Transportation needs - non-medical: Not on file  Occupational History  . Occupation: Retired  Tobacco Use  . Smoking status: Former Smoker    Types: Cigarettes    Last attempt to quit: 1971    Years since quitting: 47.9  . Smokeless tobacco: Never Used  Substance and Sexual Activity  . Alcohol use: No    Alcohol/week: 0.0 oz  . Drug use: No  . Sexual activity: Not on file  Other Topics Concern  . Not on file  Social History Narrative   Lives at home with wife and in-laws, 6 people total.   Caffeine use: 1 cup Dr. Malachi Bonds "every 1.5 weeks"   Right handed       Family History  Problem Relation Age of Onset  . Cerebral aneurysm Father   . Hypertension Sister   . Hodgkin's lymphoma Sister   . Parkinsonism Neg Hx     Past Medical History:  Diagnosis Date  .  Anxiety   . Crohn's disease ([place])   . Depression   . Fatty liver   . Heart attack ([place])   . TIA (transient ischemic attack)     Past Surgical History:  Procedure Laterality Date  . ABDOMINAL SURGERY    . COLECTOMY    . OSTOMY    . STOMA DILATATION    . TONSILLECTOMY      Current Outpatient Medications  Medication Sig Dispense Refill  . adalimumab (HUMIRA PEN) 40 MG/0.8ML injection Inject 40 mg into the skin every 14 (fourteen) days. Due Monday     12/07/12    . calcium carbonate (OS-CAL) 600 MG TABS tablet Take 600 mg by mouth daily with breakfast.     . Multiple Vitamin (MULTIVITAMIN WITH MINERALS) TABS tablet Take 1 tablet by mouth daily.    . QUEtiapine (SEROQUEL) 300 MG tablet Take 300 mg by mouth daily.  4  . rOPINIRole (REQUIP) 0.5 MG tablet Up to 2 tabs. Take up to 2  when waking up from RLS. 180 tablet 3  . Rotigotine (NEUPRO) 8  MG/24HR PT24 Place 8 mg onto the skin daily. 30 patch 11  . trihexyphenidyl (ARTANE) 2 MG tablet Take 1 tablet (2 mg total) by mouth every evening. 30 tablet 11   No current facility-administered medications for this visit.     Allergies as of 02/11/2017 - Review Complete 02/11/2017  Allergen Reaction Noted  . Aspirin  12/04/2012  . Celecoxib  12/04/2012  . Sinemet [carbidopa-levodopa] Rash 03/01/2015    Vitals: BP 123/78 (BP Location: Right Arm, Patient Position: Sitting)   Pulse 96   Ht 5' 6"  (1.676 m)   Wt 218 lb 9.6 oz (99.2 kg)   BMI 35.28 kg/m  Last Weight:  Wt Readings from Last 1 Encounters:  02/11/17 218 lb 9.6 oz (99.2 kg)   Last Height:   Ht Readings from Last 1 Encounters:  02/11/17 5' 6"  (1.676 m)    Speech:  Speech is normal; fluent and spontaneous with normal comprehension.  Cognition:  The patient is oriented to person, place, and time;   Cranial Nerves:Hypomimia  The pupils are equal, round, and reactive to light. The fundi are flat. Visual fields are full to finger confrontation. Impaired upgaze,  otherwise extraocular movements are intact. Trigeminal sensation is intact and the muscles of mastication are normal. The face is symmetric. The palate elevates in the midline. Hearing intact. Voice is normal. Shoulder shrug is normal. The tongue has normal motion without fasciculations.   Coordination:  Right arm bradykinesia compared to left  Gait:  Decreased arm right swing and re-emergent right hand tremor. Improved  Motor Observation:  Very minimal (improved) right resting tremor with some postural components Tone:  Cogwheeling upper right extremity with facilitation. Much improved.   Posture:  Slightly stooped.   Strength:  Strength is V/V in the upper and lower limbs.    Sensation: intact to LT    Assessment/Plan: 71 y.o. male here as a referral from Dr. Doreene Nest for resting tremor. He has a PMHx of Schizoaffective disorder on Seroquel, depression, anxiety, TIA, MI, Crohn's Dz, thrombocytopenia, CKD, glucose intolerance who is here for eval of tremor and memory changes. Neuro exam significant for resting tremor with postural components, right arm cogwheeling with facilitation, decreased right arm swing on walking with emergent tremor, decreased blink reflex, right hand bradykinesia. Consistent with parkinsonism. Patient is on dopamine blocking agent, unclear if this is secondary to medication effect or if he is developing idiopathic PD, other neurodegenerative disorder. However seroquel is the least likely of the atypical antidepressants to cause extrapyramidal side effects. MRi of the brain normal.  DAT scan was performed to try and evaluate for the different causes of parkinsonism, c/w Parkinson's disease (markedly abnormal DAT scan with significantly decreased tracer on the bilateral putamen and left caudate. This pattern of activity can be seen with Parkinson's disease or related syndromes.). He has responded well to Neupro patch.    He is on 16m  neupro, doing well continue. Higher doses with fatigue and confusion Artane - is it helping? May consider stopping it ropinerole at night - increase however watch for confusion, augmentation, check labs today   ASarina Ill MD  [facility] [address] [place] [id number] Phone [phone] [phone] A total of 25 minutes was spent face-to-face with this patient. Over half this time was spent on counseling patient on the PD, RLS diagnosis and different diagnostic and therapeutic options available.

## 2017-02-11 NOTE — Patient Instructions (Addendum)
Increase Ropinerole up to 4 pills at bedtime  Continue medications Labs today   Restless Legs Syndrome Restless legs syndrome is a condition that causes uncomfortable feelings or sensations in the legs, especially while sitting or lying down. The sensations usually cause an overwhelming urge to move the legs. The arms can also sometimes be affected. The condition can range from mild to severe. The symptoms often interfere with a person's ability to sleep. What are the causes? The cause of this condition is not known. What increases the risk? This condition is more likely to develop in:  People who are older than age 61.  Pregnant women. In general, restless legs syndrome is more common in women than in men.  People who have a family history of the condition.  People who have certain medical conditions, such as iron deficiency, kidney disease, Parkinson disease, or nerve damage.  People who take certain medicines, such as medicines for high blood pressure, nausea, colds, allergies, depression, and some heart conditions.  What are the signs or symptoms? The main symptom of this condition is uncomfortable sensations in the legs. These sensations may be:  Described as pulling, tingling, prickling, throbbing, crawling, or burning.  Worse while you are sitting or lying down.  Worse during periods of rest or inactivity.  Worse at night, often interfering with your sleep.  Accompanied by a very strong urge to move your legs.  Temporarily relieved by movement of your legs.  The sensations usually affect both sides of the body. The arms can also be affected, but this is rare. People who have this condition often have tiredness during the day because of their lack of sleep at night. How is this diagnosed? This condition may be diagnosed based on your description of the symptoms. You may also have tests, including blood tests, to check for other conditions that may lead to your symptoms.  In some cases, you may be asked to spend some time in a sleep lab so your sleeping can be monitored. How is this treated? Treatment for this condition is focused on managing the symptoms. Treatment may include:  Self-help and lifestyle changes.  Medicines.  Follow these instructions at home:  Take medicines only as directed by your health care provider.  Try these methods to get temporary relief from the uncomfortable sensations: ? Massage your legs. ? Walk or stretch. ? Take a cold or hot bath.  Practice good sleep habits. For example, go to bed and get up at the same time every day.  Exercise regularly.  Practice ways of relaxing, such as yoga or meditation.  Avoid caffeine and alcohol.  Do not use any tobacco products, including cigarettes, chewing tobacco, or electronic cigarettes. If you need help quitting, ask your health care provider.  Keep all follow-up visits as directed by your health care provider. This is important. Contact a health care provider if: Your symptoms do not improve with treatment, or they get worse. This information is not intended to replace advice given to you by your health care provider. Make sure you discuss any questions you have with your health care provider. Document Released: 03/01/2002 Document Revised: 08/17/2015 Document Reviewed: 03/07/2014 Elsevier Interactive Patient Education  [employer].

## 2017-02-12 ENCOUNTER — Telehealth: Payer: Self-pay

## 2017-02-12 ENCOUNTER — Telehealth: Payer: Self-pay | Admitting: *Deleted

## 2017-02-12 LAB — COMPREHENSIVE METABOLIC PANEL
ALBUMIN: 4 g/dL (ref 3.5–4.8)
ALT: 54 IU/L — ABNORMAL HIGH (ref 0–44)
AST: 68 IU/L — AB (ref 0–40)
Albumin/Globulin Ratio: 1.3 (ref 1.2–2.2)
Alkaline Phosphatase: 54 IU/L (ref 39–117)
BUN / CREAT RATIO: 11 (ref 10–24)
BUN: 14 mg/dL (ref 8–27)
Bilirubin Total: 0.9 mg/dL (ref 0.0–1.2)
CALCIUM: 9 mg/dL (ref 8.6–10.2)
CO2: 19 mmol/L — AB (ref 20–29)
CREATININE: 1.24 mg/dL (ref 0.76–1.27)
Chloride: 106 mmol/L (ref 96–106)
GFR calc Af Amer: 67 mL/min/{1.73_m2} (ref >59)
GFR, EST NON AFRICAN AMERICAN: 58 mL/min/{1.73_m2} — AB (ref >59)
GLOBULIN, TOTAL: 3.1 g/dL (ref 1.5–4.5)
Glucose: 102 mg/dL — ABNORMAL HIGH (ref 65–99)
Potassium: 4.3 mmol/L (ref 3.5–5.2)
SODIUM: 137 mmol/L (ref 134–144)
Total Protein: 7.1 g/dL (ref 6.0–8.5)

## 2017-02-12 LAB — IRON,TIBC AND FERRITIN PANEL
Ferritin: 167 ng/mL (ref 30–400)
Iron Saturation: 23 % (ref 15–55)
Iron: 70 ug/dL (ref 38–169)
Total Iron Binding Capacity: 308 ug/dL (ref 250–450)
UIBC: 238 ug/dL (ref 111–343)

## 2017-02-12 LAB — CBC
Hematocrit: 43.6 % (ref 37.5–51.0)
Hemoglobin: 14.3 g/dL (ref 13.0–17.7)
MCH: 32.6 pg (ref 26.6–33.0)
MCHC: 32.8 g/dL (ref 31.5–35.7)
MCV: 99 fL — AB (ref 79–97)
Platelets: 122 10*3/uL — ABNORMAL LOW (ref 150–379)
RBC: 4.39 x10E6/uL (ref 4.14–5.80)
RDW: 13.8 % (ref 12.3–15.4)
WBC: 4.9 10*3/uL (ref 3.4–10.8)

## 2017-02-12 NOTE — Telephone Encounter (Signed)
Called and spoke with pt's wife Nunzio Cory (on [place]). She verbalized understanding that pt's LFTs slightly elevated & platelets slightly low. She states the platelets have been that way. She states the patient will f/u with his PCP Dr. Doreene Nest for recheck of labs. I have routed the results to Dr. Doreene Nest. She is also aware that Dr. Jaynee Eagles said it's ok for Bill to take the increased dose of Ropinerale and that he will just need the LFTs rechecked in a few weeks. She had no further questions.

## 2017-02-12 NOTE — Telephone Encounter (Signed)
-----   Message from Melvenia Beam, MD sent at 02/12/2017 11:11 AM EST ----- LFts slightly elevated and platelets slightly low. Needs to follow with pcp. It is still fine to take the increased dose of Ropinerale at night, his LFTs are only slightly elevated we can recheck in a few weeks as well.

## 2017-02-12 NOTE — Telephone Encounter (Signed)
I received a prior auth request for ropinirole. I have completed and submitted the PA and should have a determination within 48-72 hours.

## 2017-02-17 NOTE — Telephone Encounter (Signed)
PA Case: [id number], Status: Approved, Coverage Starts on: 02/14/2017 12:00:00 AM, Coverage Ends on: 02/14/2018 12:00:00 AM.

## 2017-03-23 ENCOUNTER — Encounter (HOSPITAL_COMMUNITY): Payer: Self-pay | Admitting: Emergency Medicine

## 2017-03-23 ENCOUNTER — Emergency Department (HOSPITAL_COMMUNITY)
Admission: EM | Admit: 2017-03-23 | Discharge: 2017-03-23 | Disposition: A | Discharge status: Home / Self Care | Payer: Medicare HMO | Attending: Emergency Medicine | Admitting: Emergency Medicine

## 2017-03-23 DIAGNOSIS — Z79899 Other long term (current) drug therapy: Secondary | ICD-10-CM | POA: Insufficient documentation

## 2017-03-23 DIAGNOSIS — I252 Old myocardial infarction: Secondary | ICD-10-CM | POA: Diagnosis not present

## 2017-03-23 DIAGNOSIS — R339 Retention of urine, unspecified: Secondary | ICD-10-CM | POA: Diagnosis present

## 2017-03-23 DIAGNOSIS — G20 Parkinson's disease: Secondary | ICD-10-CM | POA: Diagnosis not present

## 2017-03-23 LAB — URINALYSIS, ROUTINE W REFLEX MICROSCOPIC
Bilirubin Urine: NEGATIVE
GLUCOSE, UA: NEGATIVE mg/dL
Hgb urine dipstick: NEGATIVE
Ketones, ur: 5 mg/dL — AB
Nitrite: NEGATIVE
Protein, ur: NEGATIVE mg/dL
SPECIFIC GRAVITY, URINE: 1.01 (ref 1.005–1.030)
SQUAMOUS EPITHELIAL / LPF: NONE SEEN
pH: 6 (ref 5.0–8.0)

## 2017-03-23 NOTE — ED Provider Notes (Signed)
[facility] DEPT Provider Note   CSN: [id number] Arrival date & time: 03/23/17  1245     History   Chief Complaint Chief Complaint  Patient presents with  . Urinary Retention    HPI Luke Bennett is a 71 y.o. male.  The history is provided by the patient. No language interpreter was used.  Urinary Frequency  This is a new problem. The current episode started 12 to 24 hours ago. The problem occurs constantly. The problem has been gradually worsening. Pertinent negatives include no chest pain, no abdominal pain, no headaches and no shortness of breath. Nothing aggravates the symptoms. Nothing relieves the symptoms. He has tried nothing for the symptoms. The treatment provided no relief.  Pt reports unable to urinate after multiple attempts.  Pt reports no previous problems urinating.  No history of prostate problems.   Past Medical History:  Diagnosis Date  . Anxiety   . Crohn's disease ([place])   . Depression   . Fatty liver   . Heart attack ([place])   . TIA (transient ischemic attack)     Patient Active Problem List   Diagnosis Date Noted  . Parkinson's disease ([place]) 05/02/2015  . Parkinsonism ([place]) 12/29/2014  . Tremor 09/28/2014  . Ataxia 09/28/2014  . Memory loss 09/28/2014  . Subacute confusional state 09/28/2014  . TIA (transient ischemic attack) 09/28/2014    Past Surgical History:  Procedure Laterality Date  . ABDOMINAL SURGERY    . COLECTOMY    . OSTOMY    . STOMA DILATATION    . TONSILLECTOMY         Home Medications    Prior to Admission medications   Medication Sig Start Date End Date Taking? Authorizing Provider  adalimumab (HUMIRA PEN) 40 MG/0.8ML injection Inject 40 mg into the skin every 14 (fourteen) days. Due Monday     12/07/12    [provider]  calcium carbonate (OS-CAL) 600 MG TABS tablet Take 600 mg by mouth daily with breakfast.     [provider]  Multiple Vitamin (MULTIVITAMIN WITH  MINERALS) TABS tablet Take 1 tablet by mouth daily.    [provider]  QUEtiapine (SEROQUEL) 300 MG tablet Take 300 mg by mouth daily. 09/05/14   [provider]  rOPINIRole (REQUIP) 0.5 MG tablet Take up to 4 tablets at bedtime. 02/11/17   Melvenia Beam, MD  rotigotine (NEUPRO) 6 MG/24HR Place 1 patch onto the skin daily. 02/11/17   Melvenia Beam, MD  trihexyphenidyl (ARTANE) 2 MG tablet Take 1 tablet (2 mg total) by mouth every evening. 09/24/16   Melvenia Beam, MD    Family History Family History  Problem Relation Age of Onset  . Cerebral aneurysm Father   . Hypertension Sister   . Hodgkin's lymphoma Sister   . Parkinsonism Neg Hx     Social History Social History   Tobacco Use  . Smoking status: Former Smoker    Types: Cigarettes    Last attempt to quit: 1971    Years since quitting: 48.0  . Smokeless tobacco: Never Used  Substance Use Topics  . Alcohol use: No    Alcohol/week: 0.0 oz  . Drug use: No     Allergies   Aspirin; Celecoxib; and Sinemet [carbidopa-levodopa]   Review of Systems Review of Systems  Respiratory: Negative for shortness of breath.   Cardiovascular: Negative for chest pain.  Gastrointestinal: Negative for abdominal pain.  Genitourinary: Positive for frequency.  Neurological: Negative  for headaches.  All other systems reviewed and are negative.    Physical Exam Updated Vital Signs BP 133/74 (BP Location: Right Arm)   Pulse (!) 103   Temp 98.4 F (36.9 C) (Oral)   Resp 16   SpO2 96%   Physical Exam  Constitutional: He appears well-developed and well-nourished.  HENT:  Head: Normocephalic and atraumatic.  Eyes: Conjunctivae are normal.  Neck: Neck supple.  Cardiovascular: Normal rate and regular rhythm.  No murmur heard. Pulmonary/Chest: Effort normal and breath sounds normal. No respiratory distress.  Abdominal: Soft. There is no tenderness.  Musculoskeletal: He exhibits no edema.  Neurological: He is  alert.  Skin: Skin is warm and dry.  Psychiatric: He has a normal mood and affect.  Nursing note and vitals reviewed.    ED Treatments / Results  Labs (all labs ordered are listed, but only abnormal results are displayed) Labs Reviewed  URINALYSIS, ROUTINE W REFLEX MICROSCOPIC - Abnormal; Notable for the following components:      Result Value   Ketones, ur 5 (*)    Leukocytes, UA SMALL (*)    Bacteria, UA RARE (*)    All other components within normal limits    EKG  EKG Interpretation None       Radiology No results found.  Procedures Procedures (including critical care time)  Medications Ordered in ED Medications - No data to display   Initial Impression / Assessment and Plan / ED Course  I have reviewed the triage vital signs and the nursing notes.  Pertinent labs & imaging results that were available during my care of the patient were reviewed by me and considered in my medical decision making (see chart for details).     Foley placed by RN.  Pt reports he feels much better.  Ua shows no sign of infection.   Pt advised to follow up with urology for evaluation  Foley with leg bag until recheck   Final Clinical Impressions(s) / ED Diagnoses   Final diagnoses:  Urinary retention    ED Discharge Orders    None    An After Visit Summary was printed and given to the patient.    Fransico Meadow, [place] 03/23/17 1349    Mesner, Corene Cornea, MD 03/23/17 [phone]

## 2017-03-23 NOTE — Discharge Instructions (Signed)
Return if any problems.

## 2017-03-23 NOTE — ED Provider Notes (Signed)
Medical screening examination/treatment/procedure(s) were conducted as a shared visit with non-physician practitioner(s) and myself.  I personally evaluated the patient during the encounter.  Acute urinary retention. patietn does not have fever, back pain, trauma, LE weakness or anesthesia to suggest cauda equina.  No s/s of UTI.  Did recently strt tessaslon, preliminary SE search does not reveal and adverse reactions to explain symptoms.  Feels better with foley, will plan for dc to urology follow up with the same.    Merrily Pew, MD 03/23/17 [phone]

## 2017-03-23 NOTE — ED Notes (Signed)
Bed: WLPT2 Expected date:  Expected time:  Means of arrival:  Comments: 

## 2017-03-23 NOTE — ED Triage Notes (Signed)
Patient presents with new onset urinary retention. States he went to PCP and was given water to try to give urine sample. States he was unable and they referred him to ER. Pt states unable to urinate for past day and half. Bladder scan showing greater than 900CC in bladder.

## 2017-03-26 ENCOUNTER — Other Ambulatory Visit: Payer: Self-pay

## 2017-03-26 ENCOUNTER — Encounter (HOSPITAL_COMMUNITY): Payer: Self-pay | Admitting: Emergency Medicine

## 2017-03-26 ENCOUNTER — Emergency Department (HOSPITAL_COMMUNITY)
Admission: EM | Admit: 2017-03-26 | Discharge: 2017-03-26 | Disposition: A | Discharge status: Home / Self Care | Payer: Medicare HMO | Attending: Emergency Medicine | Admitting: Emergency Medicine

## 2017-03-26 DIAGNOSIS — R339 Retention of urine, unspecified: Secondary | ICD-10-CM | POA: Diagnosis present

## 2017-03-26 DIAGNOSIS — Z8673 Personal history of transient ischemic attack (TIA), and cerebral infarction without residual deficits: Secondary | ICD-10-CM | POA: Diagnosis not present

## 2017-03-26 DIAGNOSIS — G20 Parkinson's disease: Secondary | ICD-10-CM | POA: Insufficient documentation

## 2017-03-26 DIAGNOSIS — N390 Urinary tract infection, site not specified: Secondary | ICD-10-CM | POA: Insufficient documentation

## 2017-03-26 DIAGNOSIS — I252 Old myocardial infarction: Secondary | ICD-10-CM | POA: Insufficient documentation

## 2017-03-26 DIAGNOSIS — Z79899 Other long term (current) drug therapy: Secondary | ICD-10-CM | POA: Insufficient documentation

## 2017-03-26 DIAGNOSIS — Z87891 Personal history of nicotine dependence: Secondary | ICD-10-CM | POA: Insufficient documentation

## 2017-03-26 LAB — I-STAT CHEM 8, ED
BUN: 17 mg/dL (ref 6–20)
CREATININE: 1.3 mg/dL — AB (ref 0.61–1.24)
Calcium, Ion: 1.14 mmol/L — ABNORMAL LOW (ref 1.15–1.40)
Chloride: 102 mmol/L (ref 101–111)
Glucose, Bld: 102 mg/dL — ABNORMAL HIGH (ref 65–99)
HEMATOCRIT: 45 % (ref 39.0–52.0)
HEMOGLOBIN: 15.3 g/dL (ref 13.0–17.0)
Potassium: 4 mmol/L (ref 3.5–5.1)
Sodium: 137 mmol/L (ref 135–145)
TCO2: 25 mmol/L (ref 22–32)

## 2017-03-26 LAB — URINALYSIS, ROUTINE W REFLEX MICROSCOPIC
BILIRUBIN URINE: NEGATIVE
Glucose, UA: NEGATIVE mg/dL
KETONES UR: 5 mg/dL — AB
Nitrite: NEGATIVE
PROTEIN: 100 mg/dL — AB
Specific Gravity, Urine: 1.024 (ref 1.005–1.030)
pH: 5 (ref 5.0–8.0)

## 2017-03-26 MED ORDER — LIDOCAINE HCL 2 % EX GEL
1.0000 "application " | Freq: Once | CUTANEOUS | Status: AC
  Administered 2017-03-26: 1 via URETHRAL
  Filled 2017-03-26: qty 5

## 2017-03-26 MED ORDER — LIDOCAINE VISCOUS 2 % MT SOLN: 15.0000 mL | Freq: Once | OROMUCOSAL | Status: DC

## 2017-03-26 MED ORDER — CIPROFLOXACIN HCL 500 MG PO TABS
500.0000 mg | ORAL_TABLET | Freq: Once | ORAL | Status: AC
  Administered 2017-03-26: 500 mg via ORAL
  Filled 2017-03-26: qty 1

## 2017-03-26 MED ORDER — ACETAMINOPHEN 500 MG PO TABS
1000.0000 mg | ORAL_TABLET | Freq: Once | ORAL | Status: AC
  Administered 2017-03-26: 1000 mg via ORAL
  Filled 2017-03-26: qty 2

## 2017-03-26 NOTE — ED Notes (Signed)
Pt reports that he has been having urinary retention x 1  Days. Pt reports that he has seen blood discharge the past few days

## 2017-03-26 NOTE — ED Provider Notes (Signed)
[facility] DEPT Provider Note   CSN: [id number] Arrival date & time: 03/26/17  1812     History   Chief Complaint Chief Complaint  Patient presents with  . Urinary Retention    HPI Chevy Sweigert is a 72 y.o. male.  Patient is a 72 year old male who presents with urinary retention.  He had a catheter placed in the emergency department on Sunday.  He states that has been doing well until today.  It has not been draining since about 7 AM he is noticing a little bit of blood on his underwear.  He denies seeing any clots.  No fevers.  He had does have some increased pressure to his lower abdomen.  No nausea or vomiting.  No history of urinary retention in the past.  He has an appointment to follow-up with urology tomorrow.      Past Medical History:  Diagnosis Date  . Anxiety   . Crohn's disease ([place])   . Depression   . Fatty liver   . Heart attack ([place])   . TIA (transient ischemic attack)     Patient Active Problem List   Diagnosis Date Noted  . Parkinson's disease ([place]) 05/02/2015  . Parkinsonism ([place]) 12/29/2014  . Tremor 09/28/2014  . Ataxia 09/28/2014  . Memory loss 09/28/2014  . Subacute confusional state 09/28/2014  . TIA (transient ischemic attack) 09/28/2014    Past Surgical History:  Procedure Laterality Date  . ABDOMINAL SURGERY    . COLECTOMY    . OSTOMY    . STOMA DILATATION    . TONSILLECTOMY         Home Medications    Prior to Admission medications   Medication Sig Start Date End Date Taking? Authorizing Provider  benzonatate (TESSALON) 100 MG capsule Take 100 mg by mouth 2 (two) times daily as needed for cough.  03/21/17  Yes [provider]  calcium carbonate (OS-CAL) 600 MG TABS tablet Take 600 mg by mouth daily with breakfast.    Yes [provider]  Multiple Vitamin (MULTIVITAMIN WITH MINERALS) TABS tablet Take 1 tablet by mouth daily.   Yes [provider]  QUEtiapine (SEROQUEL)  300 MG tablet Take 300 mg by mouth at bedtime.  09/05/14  Yes [provider]  rOPINIRole (REQUIP) 0.5 MG tablet Take up to 4 tablets at bedtime. Patient taking differently: Take 1 mg by mouth at bedtime. Take up to 4 tablets at bedtime. 02/11/17  Yes Melvenia Beam, MD  rotigotine (NEUPRO) 6 MG/24HR Place 1 patch onto the skin daily. 02/11/17  Yes Melvenia Beam, MD  sulfamethoxazole-trimethoprim (BACTRIM DS,SEPTRA DS) 800-160 MG tablet Take 1 tablet by mouth 2 (two) times daily.  03/23/17  Yes [provider]  trihexyphenidyl (ARTANE) 2 MG tablet Take 1 tablet (2 mg total) by mouth every evening. 09/24/16  Yes Melvenia Beam, MD  adalimumab (HUMIRA PEN) 40 MG/0.8ML injection Inject 40 mg into the skin every 14 (fourteen) days. Due Monday     12/07/12    [provider]    Family History Family History  Problem Relation Age of Onset  . Cerebral aneurysm Father   . Hypertension Sister   . Hodgkin's lymphoma Sister   . Parkinsonism Neg Hx     Social History Social History   Tobacco Use  . Smoking status: Former Smoker    Types: Cigarettes    Last attempt to quit: 1971    Years since quitting: 48.0  .  Smokeless tobacco: Never Used  Substance Use Topics  . Alcohol use: No    Alcohol/week: 0.0 oz  . Drug use: No     Allergies   Aspirin; Celecoxib; and Sinemet [carbidopa-levodopa]   Review of Systems Review of Systems  Constitutional: Negative for chills, diaphoresis, fatigue and fever.  HENT: Negative for congestion, rhinorrhea and sneezing.   Eyes: Negative.   Respiratory: Negative for cough, chest tightness and shortness of breath.   Cardiovascular: Negative for chest pain and leg swelling.  Gastrointestinal: Positive for abdominal pain. Negative for blood in stool, diarrhea, nausea and vomiting.  Genitourinary: Positive for decreased urine volume. Negative for difficulty urinating, flank pain, frequency and hematuria.  Musculoskeletal:  Negative for arthralgias and back pain.  Skin: Negative for rash.  Neurological: Negative for dizziness, speech difficulty, weakness, numbness and headaches.     Physical Exam Updated Vital Signs BP 122/78 (BP Location: Right Arm)   Pulse 87   Temp 97.7 F (36.5 C) (Oral)   Resp 18   Ht 5' 6"  (1.676 m)   Wt 95.3 kg (210 lb)   SpO2 96%   BMI 33.89 kg/m   Physical Exam  Constitutional: He is oriented to person, place, and time. He appears well-developed and well-nourished.  HENT:  Head: Normocephalic and atraumatic.  Eyes: Pupils are equal, round, and reactive to light.  Neck: Normal range of motion. Neck supple.  Cardiovascular: Normal rate, regular rhythm and normal heart sounds.  Pulmonary/Chest: Effort normal and breath sounds normal. No respiratory distress. He has no wheezes. He has no rales. He exhibits no tenderness.  Abdominal: Soft. Bowel sounds are normal. There is tenderness (Mild tenderness to suprapubic area). There is no rebound and no guarding.  Genitourinary:  Genitourinary Comments: Patient has a small amount of blood around the urethral meatus.  There is no gross blood in the urine or clots visualized.  Musculoskeletal: Normal range of motion. He exhibits no edema.  Lymphadenopathy:    He has no cervical adenopathy.  Neurological: He is alert and oriented to person, place, and time.  Skin: Skin is warm and dry. No rash noted.  Psychiatric: He has a normal mood and affect.     ED Treatments / Results  Labs (all labs ordered are listed, but only abnormal results are displayed) Labs Reviewed  URINALYSIS, ROUTINE W REFLEX MICROSCOPIC - Abnormal; Notable for the following components:      Result Value   APPearance HAZY (*)    Hgb urine dipstick LARGE (*)    Ketones, ur 5 (*)    Protein, ur 100 (*)    Leukocytes, UA MODERATE (*)    Bacteria, UA RARE (*)    Squamous Epithelial / LPF 0-5 (*)    All other components within normal limits  I-STAT CHEM 8, ED  - Abnormal; Notable for the following components:   Creatinine, Ser 1.30 (*)    Glucose, Bld 102 (*)    Calcium, Ion 1.14 (*)    All other components within normal limits  URINE CULTURE    EKG  EKG Interpretation None       Radiology No results found.  Procedures Procedures (including critical care time)  Medications Ordered in ED Medications  ciprofloxacin (CIPRO) tablet 500 mg (500 mg Oral Given 03/26/17 2149)  acetaminophen (TYLENOL) tablet 1,000 mg (1,000 mg Oral Given 03/26/17 2150)  lidocaine (XYLOCAINE) 2 % jelly 1 application (1 application Urethral Given 03/26/17 2149)     Initial Impression / Assessment and  Plan / ED Course  I have reviewed the triage vital signs and the nursing notes.  Pertinent labs & imaging results that were available during my care of the patient were reviewed by me and considered in my medical decision making (see chart for details).     Patient is a 72 year old male who presents with bleeding around his penis after Foley catheter was placed 2 days ago.  The catheter was changed in the emergency department.  It is now draining well.  There is no leakage around the catheter.  No ongoing bleeding.  No blood clots.  His pain has improved.  He otherwise is well-appearing.  His urine does appear to be infected.  It was sent for culture.  He was recently started on antibiotics on Sunday and has only been on them for less than 2 days.  At this point I did not feel that we should change his antibiotics.  He has an appointment to see urology tomorrow.  Return precautions were given.  Final Clinical Impressions(s) / ED Diagnoses   Final diagnoses:  Urinary retention  Lower urinary tract infectious disease    ED Discharge Orders    None       Malvin Johns, MD 03/26/17 2221

## 2017-03-26 NOTE — ED Triage Notes (Signed)
Patient reports he was seen on Sunday and had catheter placed due to urinary retention. Today patient returns c/o urinary retention and bleeding from penis. Reports he was sent by PCP after being told he "has a blockage." Dark urine noted in leg bag.

## 2017-03-28 LAB — URINE CULTURE: Culture: NO GROWTH

## 2017-04-04 ENCOUNTER — Emergency Department (HOSPITAL_COMMUNITY): Payer: Medicare HMO

## 2017-04-04 ENCOUNTER — Observation Stay (HOSPITAL_COMMUNITY)
Admission: EM | Admit: 2017-04-04 | Discharge: 2017-04-06 | Disposition: A | Discharge status: Home / Self Care | Payer: Medicare HMO | Attending: Internal Medicine | Admitting: Internal Medicine

## 2017-04-04 ENCOUNTER — Encounter (HOSPITAL_COMMUNITY): Payer: Self-pay | Admitting: Emergency Medicine

## 2017-04-04 DIAGNOSIS — K5090 Crohn's disease, unspecified, without complications: Secondary | ICD-10-CM | POA: Diagnosis not present

## 2017-04-04 DIAGNOSIS — F329 Major depressive disorder, single episode, unspecified: Secondary | ICD-10-CM | POA: Diagnosis not present

## 2017-04-04 DIAGNOSIS — Z86711 Personal history of pulmonary embolism: Secondary | ICD-10-CM | POA: Insufficient documentation

## 2017-04-04 DIAGNOSIS — Z79899 Other long term (current) drug therapy: Secondary | ICD-10-CM | POA: Insufficient documentation

## 2017-04-04 DIAGNOSIS — G20 Parkinson's disease: Secondary | ICD-10-CM | POA: Diagnosis not present

## 2017-04-04 DIAGNOSIS — R21 Rash and other nonspecific skin eruption: Secondary | ICD-10-CM | POA: Insufficient documentation

## 2017-04-04 DIAGNOSIS — Z807 Family history of other malignant neoplasms of lymphoid, hematopoietic and related tissues: Secondary | ICD-10-CM | POA: Insufficient documentation

## 2017-04-04 DIAGNOSIS — T83511A Infection and inflammatory reaction due to indwelling urethral catheter, initial encounter: Secondary | ICD-10-CM | POA: Diagnosis not present

## 2017-04-04 DIAGNOSIS — R0600 Dyspnea, unspecified: Secondary | ICD-10-CM | POA: Diagnosis present

## 2017-04-04 DIAGNOSIS — F419 Anxiety disorder, unspecified: Secondary | ICD-10-CM | POA: Diagnosis not present

## 2017-04-04 DIAGNOSIS — R791 Abnormal coagulation profile: Secondary | ICD-10-CM | POA: Insufficient documentation

## 2017-04-04 DIAGNOSIS — I2510 Atherosclerotic heart disease of native coronary artery without angina pectoris: Secondary | ICD-10-CM | POA: Insufficient documentation

## 2017-04-04 DIAGNOSIS — Z87891 Personal history of nicotine dependence: Secondary | ICD-10-CM | POA: Insufficient documentation

## 2017-04-04 DIAGNOSIS — G2581 Restless legs syndrome: Secondary | ICD-10-CM | POA: Insufficient documentation

## 2017-04-04 DIAGNOSIS — Z888 Allergy status to other drugs, medicaments and biological substances status: Secondary | ICD-10-CM | POA: Diagnosis not present

## 2017-04-04 DIAGNOSIS — Z886 Allergy status to analgesic agent status: Secondary | ICD-10-CM | POA: Diagnosis not present

## 2017-04-04 DIAGNOSIS — Z8249 Family history of ischemic heart disease and other diseases of the circulatory system: Secondary | ICD-10-CM | POA: Insufficient documentation

## 2017-04-04 DIAGNOSIS — D696 Thrombocytopenia, unspecified: Secondary | ICD-10-CM | POA: Insufficient documentation

## 2017-04-04 DIAGNOSIS — F259 Schizoaffective disorder, unspecified: Secondary | ICD-10-CM | POA: Diagnosis not present

## 2017-04-04 DIAGNOSIS — Z9889 Other specified postprocedural states: Secondary | ICD-10-CM | POA: Insufficient documentation

## 2017-04-04 DIAGNOSIS — I252 Old myocardial infarction: Secondary | ICD-10-CM | POA: Insufficient documentation

## 2017-04-04 DIAGNOSIS — Z8673 Personal history of transient ischemic attack (TIA), and cerebral infarction without residual deficits: Secondary | ICD-10-CM | POA: Insufficient documentation

## 2017-04-04 DIAGNOSIS — Z933 Colostomy status: Secondary | ICD-10-CM | POA: Insufficient documentation

## 2017-04-04 DIAGNOSIS — Y738 Miscellaneous gastroenterology and urology devices associated with adverse incidents, not elsewhere classified: Secondary | ICD-10-CM | POA: Insufficient documentation

## 2017-04-04 DIAGNOSIS — Z882 Allergy status to sulfonamides status: Secondary | ICD-10-CM | POA: Diagnosis not present

## 2017-04-04 DIAGNOSIS — R0602 Shortness of breath: Principal | ICD-10-CM | POA: Insufficient documentation

## 2017-04-04 DIAGNOSIS — Z9049 Acquired absence of other specified parts of digestive tract: Secondary | ICD-10-CM | POA: Insufficient documentation

## 2017-04-04 DIAGNOSIS — N179 Acute kidney failure, unspecified: Secondary | ICD-10-CM

## 2017-04-04 DIAGNOSIS — N390 Urinary tract infection, site not specified: Secondary | ICD-10-CM | POA: Insufficient documentation

## 2017-04-04 DIAGNOSIS — E871 Hypo-osmolality and hyponatremia: Secondary | ICD-10-CM | POA: Insufficient documentation

## 2017-04-04 NOTE — ED Triage Notes (Addendum)
Patient been having dry cough and SOB for about month and been treated for bronchitis. Patient has PMH PE and was referred from PCP to rule out PE. Not currently on blood thinners. Reports bilat side pain with movement.

## 2017-04-04 NOTE — ED Provider Notes (Signed)
[facility] DEPT Provider Note   CSN: [id number] Arrival date & time: 04/04/17  1750     History   Chief Complaint Chief Complaint  Patient presents with  . Cough  . Shortness of Breath    HPI Luke Bennett is a 72 y.o. male.  Patient sent from PCP for concern for pulmonary embolism.  Does have history of one in the past.  States he has been having difficulty breathing and cough for the past several weeks.  She was treated for bronchitis and improved.  Has had intermittent cough and fever since then that comes and goes.  PCP was concerned about pulmonary embolism.  Patient currently not anticoagulated.  Does have indwelling Foley in place currently that was placed on January 2 and replaced by urology yesterday.  Has 1 dose of Bactrim left.  Denies any abdominal pain, nausea or vomiting.  Denies any chest pain.  Complains of abdominal soreness when he coughs.  Has had some increased swelling in his legs that he was not aware of.  Denies any history of heart failure.   The history is provided by the patient and the spouse.  Cough  Associated symptoms include shortness of breath. Pertinent negatives include no chest pain, no headaches, no rhinorrhea and no myalgias.  Shortness of Breath  Associated symptoms include cough and leg swelling. Pertinent negatives include no fever, no headaches, no rhinorrhea, no chest pain, no vomiting and no abdominal pain.    Past Medical History:  Diagnosis Date  . Anxiety   . Crohn's disease ([place])   . Depression   . Fatty liver   . Heart attack ([place])   . TIA (transient ischemic attack)     Patient Active Problem List   Diagnosis Date Noted  . Parkinson's disease ([place]) 05/02/2015  . Parkinsonism ([place]) 12/29/2014  . Tremor 09/28/2014  . Ataxia 09/28/2014  . Memory loss 09/28/2014  . Subacute confusional state 09/28/2014  . TIA (transient ischemic attack) 09/28/2014    Past Surgical History:  Procedure  Laterality Date  . ABDOMINAL SURGERY    . COLECTOMY    . OSTOMY    . STOMA DILATATION    . TONSILLECTOMY         Home Medications    Prior to Admission medications   Medication Sig Start Date End Date Taking? Authorizing Provider  adalimumab (HUMIRA PEN) 40 MG/0.8ML injection Inject 40 mg into the skin every 14 (fourteen) days. Due Monday     12/07/12   Yes [provider]  benzonatate (TESSALON) 100 MG capsule Take 100 mg by mouth 2 (two) times daily as needed for cough.  03/21/17  Yes [provider]  calcium carbonate (OS-CAL) 600 MG TABS tablet Take 600 mg by mouth daily with breakfast.    Yes [provider]  Multiple Vitamin (MULTIVITAMIN WITH MINERALS) TABS tablet Take 1 tablet by mouth daily.   Yes [provider]  QUEtiapine (SEROQUEL) 300 MG tablet Take 300 mg by mouth at bedtime.  09/05/14  Yes [provider]  rOPINIRole (REQUIP) 0.5 MG tablet Take up to 4 tablets at bedtime. Patient taking differently: Take 1 mg by mouth at bedtime. Take up to 4 tablets at bedtime. 02/11/17  Yes Melvenia Beam, MD  rotigotine (NEUPRO) 6 MG/24HR Place 1 patch onto the skin daily. 02/11/17  Yes Melvenia Beam, MD  sulfamethoxazole-trimethoprim (BACTRIM DS,SEPTRA DS) 800-160 MG tablet Take 1 tablet by mouth 2 (two) times daily.  03/23/17  Yes [provider]  tamsulosin (FLOMAX) 0.4 MG CAPS capsule Take 0.4 mg by mouth daily after supper. 03/27/17  Yes [provider]  trihexyphenidyl (ARTANE) 2 MG tablet Take 1 tablet (2 mg total) by mouth every evening. 09/24/16  Yes Melvenia Beam, MD    Family History Family History  Problem Relation Age of Onset  . Cerebral aneurysm Father   . Hypertension Sister   . Hodgkin's lymphoma Sister   . Parkinsonism Neg Hx     Social History Social History   Tobacco Use  . Smoking status: Former Smoker    Types: Cigarettes    Last attempt to quit: 1971    Years since quitting: 48.0  .  Smokeless tobacco: Never Used  Substance Use Topics  . Alcohol use: No    Alcohol/week: 0.0 oz  . Drug use: No     Allergies   Aspirin; Celecoxib; and Sinemet [carbidopa-levodopa]   Review of Systems Review of Systems  Constitutional: Negative for activity change, appetite change and fever.  HENT: Negative for congestion and rhinorrhea.   Eyes: Negative for visual disturbance.  Respiratory: Positive for cough and shortness of breath.   Cardiovascular: Positive for leg swelling. Negative for chest pain.  Gastrointestinal: Negative for abdominal pain, nausea and vomiting.  Genitourinary: Negative for dysuria, hematuria, scrotal swelling and testicular pain.  Musculoskeletal: Negative for arthralgias, back pain and myalgias.  Neurological: Negative for dizziness, weakness, light-headedness and headaches.    all other systems are negative except as noted in the HPI and PMH.    Physical Exam Updated Vital Signs BP 119/75 (BP Location: Right Arm)   Pulse 88   Temp 98.3 F (36.8 C) (Oral)   Resp 20   SpO2 98%   Physical Exam  Constitutional: He is oriented to person, place, and time. He appears well-developed and well-nourished. No distress.  No distress, speaking in full sentences  HENT:  Head: Normocephalic and atraumatic.  Mouth/Throat: Oropharynx is clear and moist. No oropharyngeal exudate.  Eyes: Conjunctivae and EOM are normal. Pupils are equal, round, and reactive to light.  Neck: Normal range of motion. Neck supple.  No meningismus.  Cardiovascular: Normal rate, regular rhythm, normal heart sounds and intact distal pulses.  No murmur heard. Pulmonary/Chest: Effort normal and breath sounds normal. No respiratory distress. He has no wheezes.  Abdominal: Soft. There is no tenderness. There is no rebound and no guarding.  Colostomy in place  Genitourinary:  Genitourinary Comments: Foley in place with dark urine  Musculoskeletal: Normal range of motion. He exhibits  edema. He exhibits no tenderness.  +2 pretibial edema to knees  Neurological: He is alert and oriented to person, place, and time. No cranial nerve deficit. He exhibits normal muscle tone. Coordination normal.  No ataxia on finger to nose bilaterally. No pronator drift. 5/5 strength throughout. CN 2-12 intact.Equal grip strength. Sensation intact.   Skin: Skin is warm. Capillary refill takes less than 2 seconds.  Psychiatric: He has a normal mood and affect. His behavior is normal.  Nursing note and vitals reviewed.    ED Treatments / Results  Labs (all labs ordered are listed, but only abnormal results are displayed) Labs Reviewed  CBC WITH DIFFERENTIAL/PLATELET - Abnormal; Notable for the following components:      Result Value   Platelets 115 (*)    All other components within normal limits  BASIC METABOLIC PANEL - Abnormal; Notable for the following components:   Sodium 131 (*)  CO2 20 (*)    Glucose, Bld 140 (*)    BUN 34 (*)    Creatinine, Ser 2.05 (*)    GFR calc non Af Amer 31 (*)    GFR calc Af Amer 36 (*)    All other components within normal limits  URINALYSIS, ROUTINE W REFLEX MICROSCOPIC - Abnormal; Notable for the following components:   Color, Urine AMBER (*)    APPearance CLOUDY (*)    Hgb urine dipstick LARGE (*)    Protein, ur 100 (*)    Leukocytes, UA MODERATE (*)    Bacteria, UA RARE (*)    Squamous Epithelial / LPF 0-5 (*)    All other components within normal limits  D-DIMER, QUANTITATIVE (NOT AT [facility]) - Abnormal; Notable for the following components:   D-Dimer, Quant 4.46 (*)    All other components within normal limits  CK - Abnormal; Notable for the following components:   Total CK 26 (*)    All other components within normal limits  URINE CULTURE  BRAIN NATRIURETIC PEPTIDE  BASIC METABOLIC PANEL    EKG  EKG Interpretation  Date/Time:  Friday April 04 2017 23:58:49 EST Ventricular Rate:  90 PR Interval:    QRS Duration: 94 QT  Interval:  356 QTC Calculation: 436 R Axis:   -12 Text Interpretation:  Sinus rhythm Abnormal R-wave progression, early transition Left ventricular hypertrophy No previous ECGs available Confirmed by Ezequiel Essex [phone]) on 04/05/2017 12:03:51 AM       Radiology Dg Chest 2 View  Result Date: 04/04/2017 CLINICAL DATA:  Cough and shortness of breath for 1 month. EXAM: CHEST  2 VIEW COMPARISON:  None. FINDINGS: The heart size and mediastinal contours are within normal limits. Mild linear opacity of the left lung base. There is no pulmonary edema or pleural effusion. The visualized skeletal structures are unremarkable. IMPRESSION: Mild linear opacity of the left lung base favor linear atelectasis. Electronically Signed   By: Abelardo Diesel M.D.   On: 04/04/2017 19:27    Procedures Procedures (including critical care time)  Medications Ordered in ED Medications - No data to display   Initial Impression / Assessment and Plan / ED Course  I have reviewed the triage vital signs and the nursing notes.  Pertinent labs & imaging results that were available during my care of the patient were reviewed by me and considered in my medical decision making (see chart for details).    Patient with several week history of dyspnea coughing and shortness of breath.  Sent from PCP to rule out PE.  Denies chest pain. Chest x-ray negative.  D-dimer is elevated.  Patient with history of PE in the past.  Labs show AK I with Foley in place.  Unable to give contrast for PE study.  With elevated creatinine, unable to give CT contrast.  Will be arranged for VQ scan morning.  With patient's acute renal failure plan admission overnight for hydration.  Antibiotic started for catheter associated UTI.  VQ scan pending.  Admission discussed with Dr. Alcario Drought.  Final Clinical Impressions(s) / ED Diagnoses   Final diagnoses:  Acute renal failure, unspecified acute renal failure type ([place])  Dyspnea, unspecified  type    ED Discharge Orders    None       Ezequiel Essex, MD 04/05/17 [id number]

## 2017-04-05 ENCOUNTER — Encounter (HOSPITAL_COMMUNITY): Payer: Self-pay | Admitting: Internal Medicine

## 2017-04-05 ENCOUNTER — Other Ambulatory Visit (HOSPITAL_COMMUNITY): Payer: Medicare HMO

## 2017-04-05 ENCOUNTER — Observation Stay (HOSPITAL_COMMUNITY): Payer: Medicare HMO

## 2017-04-05 ENCOUNTER — Other Ambulatory Visit: Payer: Self-pay

## 2017-04-05 DIAGNOSIS — T83511D Infection and inflammatory reaction due to indwelling urethral catheter, subsequent encounter: Secondary | ICD-10-CM | POA: Diagnosis not present

## 2017-04-05 DIAGNOSIS — R0602 Shortness of breath: Secondary | ICD-10-CM | POA: Diagnosis not present

## 2017-04-05 DIAGNOSIS — T83511A Infection and inflammatory reaction due to indwelling urethral catheter, initial encounter: Secondary | ICD-10-CM

## 2017-04-05 DIAGNOSIS — N390 Urinary tract infection, site not specified: Secondary | ICD-10-CM

## 2017-04-05 DIAGNOSIS — R0600 Dyspnea, unspecified: Secondary | ICD-10-CM

## 2017-04-05 DIAGNOSIS — N179 Acute kidney failure, unspecified: Secondary | ICD-10-CM

## 2017-04-05 DIAGNOSIS — G20 Parkinson's disease: Secondary | ICD-10-CM | POA: Diagnosis not present

## 2017-04-05 DIAGNOSIS — F259 Schizoaffective disorder, unspecified: Secondary | ICD-10-CM

## 2017-04-05 LAB — CBC WITH DIFFERENTIAL/PLATELET
Basophils Absolute: 0 10*3/uL (ref 0.0–0.1)
Basophils Relative: 1 %
EOS PCT: 7 %
Eosinophils Absolute: 0.5 10*3/uL (ref 0.0–0.7)
HCT: 44.9 % (ref 39.0–52.0)
Hemoglobin: 15.7 g/dL (ref 13.0–17.0)
LYMPHS ABS: 1.3 10*3/uL (ref 0.7–4.0)
LYMPHS PCT: 19 %
MCH: 32.8 pg (ref 26.0–34.0)
MCHC: 35 g/dL (ref 30.0–36.0)
MCV: 93.9 fL (ref 78.0–100.0)
MONO ABS: 0.5 10*3/uL (ref 0.1–1.0)
Monocytes Relative: 8 %
Neutro Abs: 4.4 10*3/uL (ref 1.7–7.7)
Neutrophils Relative %: 65 %
PLATELETS: 115 10*3/uL — AB (ref 150–400)
RBC: 4.78 MIL/uL (ref 4.22–5.81)
RDW: 13.3 % (ref 11.5–15.5)
WBC: 6.7 10*3/uL (ref 4.0–10.5)

## 2017-04-05 LAB — BASIC METABOLIC PANEL
Anion gap: 5 (ref 5–15)
Anion gap: 8 (ref 5–15)
BUN: 28 mg/dL — ABNORMAL HIGH (ref 6–20)
BUN: 34 mg/dL — AB (ref 6–20)
CALCIUM: 7.9 mg/dL — AB (ref 8.9–10.3)
CALCIUM: 8.9 mg/dL (ref 8.9–10.3)
CO2: 18 mmol/L — AB (ref 22–32)
CO2: 20 mmol/L — AB (ref 22–32)
CREATININE: 1.57 mg/dL — AB (ref 0.61–1.24)
Chloride: 103 mmol/L (ref 101–111)
Chloride: 104 mmol/L (ref 101–111)
Creatinine, Ser: 2.05 mg/dL — ABNORMAL HIGH (ref 0.61–1.24)
GFR calc Af Amer: 36 mL/min — ABNORMAL LOW (ref >60)
GFR calc non Af Amer: 43 mL/min — ABNORMAL LOW (ref >60)
GFR, EST AFRICAN AMERICAN: 49 mL/min — AB (ref >60)
GFR, EST NON AFRICAN AMERICAN: 31 mL/min — AB (ref >60)
GLUCOSE: 116 mg/dL — AB (ref 65–99)
GLUCOSE: 140 mg/dL — AB (ref 65–99)
Potassium: 4.5 mmol/L (ref 3.5–5.1)
Potassium: 5.4 mmol/L — ABNORMAL HIGH (ref 3.5–5.1)
Sodium: 127 mmol/L — ABNORMAL LOW (ref 135–145)
Sodium: 131 mmol/L — ABNORMAL LOW (ref 135–145)

## 2017-04-05 LAB — URINALYSIS, ROUTINE W REFLEX MICROSCOPIC
Bilirubin Urine: NEGATIVE
Glucose, UA: NEGATIVE mg/dL
KETONES UR: NEGATIVE mg/dL
Nitrite: NEGATIVE
PROTEIN: 100 mg/dL — AB
Specific Gravity, Urine: 1.025 (ref 1.005–1.030)
pH: 5 (ref 5.0–8.0)

## 2017-04-05 LAB — D-DIMER, QUANTITATIVE (NOT AT ARMC): D DIMER QUANT: 4.46 ug{FEU}/mL — AB (ref 0.00–0.50)

## 2017-04-05 LAB — BRAIN NATRIURETIC PEPTIDE: B Natriuretic Peptide: 12.7 pg/mL (ref 0.0–100.0)

## 2017-04-05 LAB — CK: Total CK: 26 U/L — ABNORMAL LOW (ref 49–397)

## 2017-04-05 MED ORDER — QUETIAPINE FUMARATE 100 MG PO TABS
300.0000 mg | ORAL_TABLET | Freq: Every day | ORAL | Status: DC
  Administered 2017-04-05: 300 mg via ORAL
  Filled 2017-04-05: qty 3

## 2017-04-05 MED ORDER — TRIHEXYPHENIDYL HCL 2 MG PO TABS
2.0000 mg | ORAL_TABLET | Freq: Every evening | ORAL | Status: DC
  Administered 2017-04-05: 2 mg via ORAL
  Filled 2017-04-05 (×2): qty 1

## 2017-04-05 MED ORDER — SODIUM CHLORIDE 0.9 % IV SOLN
INTRAVENOUS | Status: DC
  Administered 2017-04-05 – 2017-04-06 (×3): via INTRAVENOUS

## 2017-04-05 MED ORDER — ENOXAPARIN SODIUM 40 MG/0.4ML ~~LOC~~ SOLN
40.0000 mg | SUBCUTANEOUS | Status: DC
  Administered 2017-04-05 – 2017-04-06 (×2): 40 mg via SUBCUTANEOUS
  Filled 2017-04-05 (×2): qty 0.4

## 2017-04-05 MED ORDER — SODIUM CHLORIDE 0.9 % IV BOLUS (SEPSIS)
1000.0000 mL | Freq: Once | INTRAVENOUS | Status: AC
  Administered 2017-04-05: 1000 mL via INTRAVENOUS

## 2017-04-05 MED ORDER — TAMSULOSIN HCL 0.4 MG PO CAPS
0.4000 mg | ORAL_CAPSULE | Freq: Every day | ORAL | Status: DC
  Administered 2017-04-05: 0.4 mg via ORAL
  Filled 2017-04-05: qty 1

## 2017-04-05 MED ORDER — ONDANSETRON HCL 4 MG/2ML IJ SOLN: 4.0000 mg | Freq: Four times a day (QID) | INTRAMUSCULAR | Status: DC | PRN

## 2017-04-05 MED ORDER — ROTIGOTINE 6 MG/24HR TD PT24: 1.0000 | MEDICATED_PATCH | Freq: Every day | TRANSDERMAL | Status: DC

## 2017-04-05 MED ORDER — ROPINIROLE HCL 1 MG PO TABS
1.0000 mg | ORAL_TABLET | Freq: Every day | ORAL | Status: DC
  Administered 2017-04-05: 1 mg via ORAL
  Filled 2017-04-05: qty 1

## 2017-04-05 MED ORDER — CALCIUM CARBONATE 1250 (500 CA) MG PO TABS
1.0000 | ORAL_TABLET | Freq: Every day | ORAL | Status: DC
  Administered 2017-04-05 – 2017-04-06 (×2): 500 mg via ORAL
  Filled 2017-04-05 (×2): qty 1

## 2017-04-05 MED ORDER — DEXTROSE 5 % IV SOLN
1.0000 g | Freq: Two times a day (BID) | INTRAVENOUS | Status: DC
  Administered 2017-04-05 – 2017-04-06 (×3): 1 g via INTRAVENOUS
  Filled 2017-04-05 (×4): qty 1

## 2017-04-05 MED ORDER — TAMSULOSIN HCL 0.4 MG PO CAPS: 0.8000 mg | ORAL_CAPSULE | Freq: Every day | ORAL | Status: DC

## 2017-04-05 MED ORDER — TECHNETIUM TO 99M ALBUMIN AGGREGATED
4.0000 | Freq: Once | INTRAVENOUS | Status: AC | PRN
  Administered 2017-04-05: 4 via INTRAVENOUS

## 2017-04-05 MED ORDER — TECHNETIUM TC 99M DIETHYLENETRIAME-PENTAACETIC ACID
30.0000 | Freq: Once | INTRAVENOUS | Status: AC | PRN
  Administered 2017-04-05: 30 via RESPIRATORY_TRACT

## 2017-04-05 MED ORDER — BENZONATATE 100 MG PO CAPS: 100.0000 mg | ORAL_CAPSULE | Freq: Two times a day (BID) | ORAL | Status: DC | PRN

## 2017-04-05 MED ORDER — ACETAMINOPHEN 325 MG PO TABS: 650.0000 mg | ORAL_TABLET | Freq: Four times a day (QID) | ORAL | Status: DC | PRN

## 2017-04-05 MED ORDER — TAMSULOSIN HCL 0.4 MG PO CAPS: 0.4000 mg | ORAL_CAPSULE | Freq: Every day | ORAL | Status: DC

## 2017-04-05 MED ORDER — PENTOSAN POLYSULFATE SODIUM 100 MG PO CAPS
100.0000 mg | ORAL_CAPSULE | Freq: Three times a day (TID) | ORAL | Status: DC
  Administered 2017-04-05 – 2017-04-06 (×4): 100 mg via ORAL
  Filled 2017-04-05 (×5): qty 1

## 2017-04-05 MED ORDER — ADULT MULTIVITAMIN W/MINERALS CH
1.0000 | ORAL_TABLET | Freq: Every day | ORAL | Status: DC
  Administered 2017-04-05 – 2017-04-06 (×2): 1 via ORAL
  Filled 2017-04-05 (×2): qty 1

## 2017-04-05 MED ORDER — DEXTROSE 5 % IV SOLN: 1.0000 g | INTRAVENOUS | Status: DC

## 2017-04-05 MED ORDER — ACETAMINOPHEN 650 MG RE SUPP: 650.0000 mg | Freq: Four times a day (QID) | RECTAL | Status: DC | PRN

## 2017-04-05 MED ORDER — ONDANSETRON HCL 4 MG PO TABS: 4.0000 mg | ORAL_TABLET | Freq: Four times a day (QID) | ORAL | Status: DC | PRN

## 2017-04-05 NOTE — ED Notes (Signed)
ED TO INPATIENT HANDOFF REPORT  Name/Age/Gender Luke Bennett 72 y.o. male  Code Status    Code Status Orders  (From admission, onward)        Start     Ordered   04/05/17 0127  Full code  Continuous     04/05/17 0127    Code Status History    Date Active Date Inactive Code Status Order ID Comments User Context   This patient has a current code status but no historical code status.      Home/SNF/Other Home  Chief Complaint Possible blood clot on lung, sent over by dr  Level of Care/Admitting Diagnosis ED Disposition    ED Disposition Condition Comment   [place]: [place] [100102]  Level of Care: Telemetry [5]  Admit to tele based on following criteria: Other see comments  Comments: SOB, obs for VQ scan to r/o PE  Diagnosis: AKI (acute kidney injury) [facility]) [161096]  Admitting Physician: Etta Quill [phone]  Attending Physician: Etta Quill [[id number]]  PT Class (Do Not Modify): Observation [104]  PT Acc Code (Do Not Modify): Observation [10022]       Medical History Past Medical History:  Diagnosis Date  . Anxiety   . Crohn's disease ([place])   . Depression   . Fatty liver   . Heart attack ([place])   . Pulmonary embolism ([place])   . TIA (transient ischemic attack)     Allergies Allergies  Allergen Reactions  . Aspirin     Stomach bleeding   . Celecoxib     Stomach pain  . Sinemet [Carbidopa-Levodopa] Rash    IV Location/Drains/Wounds Patient Lines/Drains/Airways Status   Active Line/Drains/Airways    Name:   Placement date:   Placement time:   Site:   Days:   Urethral Catheter C. Corridon, RN  Straight-tip   03/26/17    1952    Straight-tip   10          Labs/Imaging Results for orders placed or performed during the hospital encounter of 04/04/17 (from the past 48 hour(s))  D-dimer, quantitative (not at [facility])     Status: Abnormal   Collection Time: 04/04/17  5:50 PM  Result Value Ref Range   D-Dimer,  Quant 4.46 (H) 0.00 - 0.50 ug/mL-FEU    Comment: (NOTE) At the manufacturer cut-off of 0.50 ug/mL FEU, this assay has been documented to exclude PE with a sensitivity and negative predictive value of 97 to 99%.  At this time, this assay has not been approved by the FDA to exclude DVT/VTE. Results should be correlated with clinical presentation.   Brain natriuretic peptide     Status: None   Collection Time: 04/04/17 10:57 PM  Result Value Ref Range   B Natriuretic Peptide 12.7 0.0 - 100.0 pg/mL  CBC with Differential/Platelet     Status: Abnormal   Collection Time: 04/04/17 11:53 PM  Result Value Ref Range   WBC 6.7 4.0 - 10.5 K/uL   RBC 4.78 4.22 - 5.81 MIL/uL   Hemoglobin 15.7 13.0 - 17.0 g/dL   HCT 44.9 39.0 - 52.0 %   MCV 93.9 78.0 - 100.0 fL   MCH 32.8 26.0 - 34.0 pg   MCHC 35.0 30.0 - 36.0 g/dL   RDW 13.3 11.5 - 15.5 %   Platelets 115 (L) 150 - 400 K/uL    Comment: SPECIMEN CHECKED FOR CLOTS PLATELET COUNT CONFIRMED BY SMEAR    Neutrophils Relative % 65 %  Neutro Abs 4.4 1.7 - 7.7 K/uL   Lymphocytes Relative 19 %   Lymphs Abs 1.3 0.7 - 4.0 K/uL   Monocytes Relative 8 %   Monocytes Absolute 0.5 0.1 - 1.0 K/uL   Eosinophils Relative 7 %   Eosinophils Absolute 0.5 0.0 - 0.7 K/uL   Basophils Relative 1 %   Basophils Absolute 0.0 0.0 - 0.1 K/uL  Basic metabolic panel     Status: Abnormal   Collection Time: 04/04/17 11:53 PM  Result Value Ref Range   Sodium 131 (L) 135 - 145 mmol/L   Potassium 4.5 3.5 - 5.1 mmol/L   Chloride 103 101 - 111 mmol/L   CO2 20 (L) 22 - 32 mmol/L   Glucose, Bld 140 (H) 65 - 99 mg/dL   BUN 34 (H) 6 - 20 mg/dL   Creatinine, Ser 2.05 (H) 0.61 - 1.24 mg/dL   Calcium 8.9 8.9 - 10.3 mg/dL   GFR calc non Af Amer 31 (L) >60 mL/min   GFR calc Af Amer 36 (L) >60 mL/min    Comment: (NOTE) The eGFR has been calculated using the CKD EPI equation. This calculation has not been validated in all clinical situations. eGFR's persistently <60 mL/min  signify possible Chronic Kidney Disease.    Anion gap 8 5 - 15  Urinalysis, Routine w reflex microscopic     Status: Abnormal   Collection Time: 04/05/17 12:19 AM  Result Value Ref Range   Color, Urine AMBER (A) YELLOW    Comment: BIOCHEMICALS MAY BE AFFECTED BY COLOR   APPearance CLOUDY (A) CLEAR   Specific Gravity, Urine 1.025 1.005 - 1.030   pH 5.0 5.0 - 8.0   Glucose, UA NEGATIVE NEGATIVE mg/dL   Hgb urine dipstick LARGE (A) NEGATIVE   Bilirubin Urine NEGATIVE NEGATIVE   Ketones, ur NEGATIVE NEGATIVE mg/dL   Protein, ur 100 (A) NEGATIVE mg/dL   Nitrite NEGATIVE NEGATIVE   Leukocytes, UA MODERATE (A) NEGATIVE   RBC / HPF 0-5 0 - 5 RBC/hpf   WBC, UA TOO NUMEROUS TO COUNT 0 - 5 WBC/hpf   Bacteria, UA RARE (A) NONE SEEN   Squamous Epithelial / LPF 0-5 (A) NONE SEEN   Mucus PRESENT    Uric Acid Crys, UA PRESENT    Dg Chest 2 View  Result Date: 04/04/2017 CLINICAL DATA:  Cough and shortness of breath for 1 month. EXAM: CHEST  2 VIEW COMPARISON:  None. FINDINGS: The heart size and mediastinal contours are within normal limits. Mild linear opacity of the left lung base. There is no pulmonary edema or pleural effusion. The visualized skeletal structures are unremarkable. IMPRESSION: Mild linear opacity of the left lung base favor linear atelectasis. Electronically Signed   By: Abelardo Diesel M.D.   On: 04/04/2017 19:27    Pending Labs Unresulted Labs (From admission, onward)   Start     Ordered   04/05/17 0127  CK  Once,   R     04/05/17 0127   04/04/17 2307  Urine Culture  Once,   STAT     04/04/17 2306      Vitals/Pain Today's Vitals   04/05/17 0000 04/05/17 0030 04/05/17 0100 04/05/17 0130  BP:  124/79 134/76 120/75  Pulse: 95 87 84 84  Resp: (!) 23  19 (!) 22  Temp:      TempSrc:      SpO2: 97% 98% 98% 98%    Isolation Precautions No active isolations  Medications Medications  sodium chloride  0.9 % bolus 1,000 mL (not administered)  acetaminophen (TYLENOL)  tablet 650 mg (not administered)    Or  acetaminophen (TYLENOL) suppository 650 mg (not administered)  ondansetron (ZOFRAN) tablet 4 mg (not administered)    Or  ondansetron (ZOFRAN) injection 4 mg (not administered)  enoxaparin (LOVENOX) injection 40 mg (not administered)  benzonatate (TESSALON) capsule 100 mg (not administered)  calcium carbonate (OS-CAL) tablet 600 mg (not administered)  QUEtiapine (SEROQUEL) tablet 300 mg (not administered)  multivitamin with minerals tablet 1 tablet (not administered)  rOPINIRole (REQUIP) tablet 1 mg (not administered)  rotigotine (NEUPRO) 6 MG/24HR 1 patch (not administered)  tamsulosin (FLOMAX) capsule 0.4 mg (not administered)  trihexyphenidyl (ARTANE) tablet 2 mg (not administered)  ceFEPIme (MAXIPIME) 1 g in dextrose 5 % 50 mL IVPB (not administered)    Mobility walks

## 2017-04-05 NOTE — H&P (Signed)
History and Physical    Luke Bennett HYH:[id number] DOB: 1945/04/13 DOA: 04/04/2017  PCP: Katherina Mires, MD  Patient coming from: Home  I have personally briefly reviewed patient's old medical records in [place]  Chief Complaint: SOB  HPI: Luke Bennett is a 72 y.o. male with medical history significant of Crohn's disease, PE, MI.  Patient presents to the ED with several week history of cough and SOB.  Treated for bronchitis, symptoms improved.  Intermittent cough and fever since that time.  PCP worried about PE so sent patient in to ED.  Also of note, patient with recent urinary obstruction relieved with foley.  Complicated by episode of thrombus obstructing foley earlier this month.  Dark urine now but no frank hematuria.  On 10 days of bactrim, has 1 dose left.  No abd pain, no N/V, no flank pain, no CP.   ED Course: Creat 2.0 up from 1.3 baseline x10 days ago.  BUN 34 up from 17 (not just a bactrim creat effect).  UA shows too numerous to count WBC, mod leuk esterase, 0-5 RBCs, large HGB though.   Review of Systems: As per HPI otherwise 10 point review of systems negative.   Past Medical History:  Diagnosis Date  . Anxiety   . Crohn's disease ([place])   . Depression   . Fatty liver   . Heart attack ([place])   . Pulmonary embolism ([place])   . TIA (transient ischemic attack)     Past Surgical History:  Procedure Laterality Date  . ABDOMINAL SURGERY    . COLECTOMY    . OSTOMY    . STOMA DILATATION    . TONSILLECTOMY       reports that he quit smoking about 48 years ago. His smoking use included cigarettes. he has never used smokeless tobacco. He reports that he does not drink alcohol or use drugs.  Allergies  Allergen Reactions  . Aspirin     Stomach bleeding   . Celecoxib     Stomach pain  . Sinemet [Carbidopa-Levodopa] Rash    Family History  Problem Relation Age of Onset  . Cerebral aneurysm Father   . Hypertension Sister   . Hodgkin's lymphoma Sister     . Parkinsonism Neg Hx      Prior to Admission medications   Medication Sig Start Date End Date Taking? Authorizing Provider  adalimumab (HUMIRA PEN) 40 MG/0.8ML injection Inject 40 mg into the skin every 14 (fourteen) days. Due Monday     12/07/12   Yes [provider]  benzonatate (TESSALON) 100 MG capsule Take 100 mg by mouth 2 (two) times daily as needed for cough.  03/21/17  Yes [provider]  calcium carbonate (OS-CAL) 600 MG TABS tablet Take 600 mg by mouth daily with breakfast.    Yes [provider]  Multiple Vitamin (MULTIVITAMIN WITH MINERALS) TABS tablet Take 1 tablet by mouth daily.   Yes [provider]  QUEtiapine (SEROQUEL) 300 MG tablet Take 300 mg by mouth at bedtime.  09/05/14  Yes [provider]  rOPINIRole (REQUIP) 0.5 MG tablet Take up to 4 tablets at bedtime. Patient taking differently: Take 1 mg by mouth at bedtime. Take up to 4 tablets at bedtime. 02/11/17  Yes Melvenia Beam, MD  rotigotine (NEUPRO) 6 MG/24HR Place 1 patch onto the skin daily. 02/11/17  Yes Melvenia Beam, MD  tamsulosin (FLOMAX) 0.4 MG CAPS capsule Take 0.4 mg by mouth daily after supper. 03/27/17  Yes [provider]  trihexyphenidyl (ARTANE) 2 MG tablet Take 1 tablet (2 mg total) by mouth every evening. 09/24/16  Yes Melvenia Beam, MD    Physical Exam: Vitals:   04/05/17 0000 04/05/17 0030 04/05/17 0100 04/05/17 0130  BP:  124/79 134/76 120/75  Pulse: 95 87 84 84  Resp: (!) 23  19 (!) 22  Temp:      TempSrc:      SpO2: 97% 98% 98% 98%    Constitutional: NAD, calm, comfortable Eyes: PERRL, lids and conjunctivae normal ENMT: Mucous membranes are moist. Posterior pharynx clear of any exudate or lesions.Normal dentition.  Neck: normal, supple, no masses, no thyromegaly Respiratory: clear to auscultation bilaterally, no wheezing, no crackles. Normal respiratory effort. No accessory muscle use.  Cardiovascular: Regular rate and rhythm,  no murmurs / rubs / gallops. No extremity edema. 2+ pedal pulses. No carotid bruits.  Abdomen: no tenderness, no masses palpated. No hepatosplenomegaly. Bowel sounds positive. Ostomy present Musculoskeletal: no clubbing / cyanosis. No joint deformity upper and lower extremities. Good ROM, no contractures. Normal muscle tone.  Skin: no rashes, lesions, ulcers. No induration Neurologic: CN 2-12 grossly intact. Sensation intact, DTR normal. Strength 5/5 in all 4.  Psychiatric: Normal judgment and insight. Alert and oriented x 3. Normal mood.    Labs on Admission: I have personally reviewed following labs and imaging studies  CBC: Recent Labs  Lab 04/04/17 2353  WBC 6.7  NEUTROABS 4.4  HGB 15.7  HCT 44.9  MCV 93.9  PLT 937*   Basic Metabolic Panel: Recent Labs  Lab 04/04/17 2353  NA 131*  K 4.5  CL 103  CO2 20*  GLUCOSE 140*  BUN 34*  CREATININE 2.05*  CALCIUM 8.9   GFR: Estimated Creatinine Clearance: 35.7 mL/min (A) (by C-G formula based on SCr of 2.05 mg/dL (H)). Liver Function Tests: No results for input(s): AST, ALT, ALKPHOS, BILITOT, PROT, ALBUMIN in the last 168 hours. No results for input(s): LIPASE, AMYLASE in the last 168 hours. No results for input(s): AMMONIA in the last 168 hours. Coagulation Profile: No results for input(s): INR, PROTIME in the last 168 hours. Cardiac Enzymes: No results for input(s): CKTOTAL, CKMB, CKMBINDEX, TROPONINI in the last 168 hours. BNP (last 3 results) No results for input(s): PROBNP in the last 8760 hours. HbA1C: No results for input(s): HGBA1C in the last 72 hours. CBG: No results for input(s): GLUCAP in the last 168 hours. Lipid Profile: No results for input(s): CHOL, HDL, LDLCALC, TRIG, CHOLHDL, LDLDIRECT in the last 72 hours. Thyroid Function Tests: No results for input(s): TSH, T4TOTAL, FREET4, T3FREE, THYROIDAB in the last 72 hours. Anemia Panel: No results for input(s): VITAMINB12, FOLATE, FERRITIN, TIBC, IRON,  RETICCTPCT in the last 72 hours. Urine analysis:    Component Value Date/Time   COLORURINE AMBER (A) 04/05/2017 0019   APPEARANCEUR CLOUDY (A) 04/05/2017 0019   LABSPEC 1.025 04/05/2017 0019   PHURINE 5.0 04/05/2017 0019   GLUCOSEU NEGATIVE 04/05/2017 0019   HGBUR LARGE (A) 04/05/2017 0019   BILIRUBINUR NEGATIVE 04/05/2017 0019   KETONESUR NEGATIVE 04/05/2017 0019   PROTEINUR 100 (A) 04/05/2017 0019   NITRITE NEGATIVE 04/05/2017 0019   LEUKOCYTESUR MODERATE (A) 04/05/2017 0019    Radiological Exams on Admission: Dg Chest 2 View  Result Date: 04/04/2017 CLINICAL DATA:  Cough and shortness of breath for 1 month. EXAM: CHEST  2 VIEW COMPARISON:  None. FINDINGS: The heart size and mediastinal contours are within normal limits. Mild linear opacity of  the left lung base. There is no pulmonary edema or pleural effusion. The visualized skeletal structures are unremarkable. IMPRESSION: Mild linear opacity of the left lung base favor linear atelectasis. Electronically Signed   By: Abelardo Diesel M.D.   On: 04/04/2017 19:27    EKG: Independently reviewed.  Assessment/Plan Principal Problem:   SOB (shortness of breath) Active Problems:   Parkinsonism (HCC)   AKI (acute kidney injury) ([place])   Catheter-associated urinary tract infection ([place])    1. SOB - 1. VQ scan in AM 2. Lovenox DVT ppx dose for now 3. Watch for development of hematuria given history earlier this month 4. If VQ scan neg, then would obtain 2d echo as next step in work up.  H/o MI and PE, no prior EKGs available.  ?CHF, ? pulm HTN. 2. AKI - 1. US renal to r/o obstruction 2. 1L NS bolus 3. Strict intake and output 4. Repeat BMP in AM 5. Checking CPK to r/o rhabdo 3. Possible CAUTI 1. Cefepime 2. Culture pending 4. Parkinsonism - continue home meds  DVT prophylaxis: Lovenox DVT ppx dose for now Code Status: Full Family Communication: Family at bedside Disposition Plan: Home after admit Consults called:  None Admission status: Place in Cataract, [place] [facility] Pager [phone]  If 7AM-7PM, please contact day team taking care of patient [email or web address] Password [email or web address]  04/05/2017, 2:05 AM

## 2017-04-05 NOTE — Progress Notes (Addendum)
PROGRESS NOTE  [name]  EPP:[id number] DOB: 10-21-1945 DOA: 04/04/2017 PCP: Katherina Mires, MD  Brief Narrative:   Luke Bennett is a 72 y.o. male with Crohn's disease, PE, MI, parkinson's disease, schizoaffective disorder.  Patient presented to the ED with several week history of cough and SOB.  Treated for bronchitis, symptoms improved.  Intermittent cough and fever since that time.  PCP worried about PE so sent patient in to ED.  Patient's primary concern is his dysuria.  The patient was seen on 1/2 for acute urinary retention.  He had a Foley catheter placed and was started on Flomax.  His urine culture from that date was negative but he has had worsening dysuria and bladder spasms since placement of his catheter.  He has had intermittent hematuria with clots that have obstructed his catheter but which fortunately spontaneously improved.  He was given a prescription for Bactrim and he is completed his course, however a day or 2 into taking his medication he developed a diffuse erythematous full body rash.  His urine has remained cloudy.  He was seen in the emergency department and found to have acute kidney injury.  His d-dimer was elevated and he is awaiting a VQ scan.    Assessment & Plan:  Dysuria, possible CAUTI -  Continue cefepime -  F/u urine culture -  Start pentosan   Acute urinary retention -  Continue foley -  Continue flomax -  Will need follow up with Urology  AKI, creatinine trending down with IVF.  Suspect this was due to intermittent obstruction of his catheter and bactrim -  Continue IVF -  Repeat BMP in AM -  Bactrim discontinued  Hyponatremia, likely due to acute urinary obstruction.   -  Continue IVF and repeat BMP in AM  Macular full body rash, somewhat petechial on the legs, suspect drug rash  -  D/c bactrim and added to allergy list  DOE, history of CAD and also has elevated D-dimer.  Patient denies dyspnea when at rest and currently on room air -  VQ  scan pending -  ECHO -  CXR demonstrated atelectasis:  Incentive spirometry  Crohn's disease, stable.  Has ostomy  Parkinson's with anxiety and depression ("long psychiatric history" per Neurology notes) -  Continue artane (trihexyphenidyl), neupro  Schizoaffective disorder -  Continue seroquel (per notes, do NOT stop seroquel)  RLS, stable, continue requip  DVT prophylaxis:  lovenox Code Status:  Full code Family Communication:  Patient alone Disposition Plan:  Possibly home tomorrow pending results of ECHO, VQ scan, creatinine trending down   Consultants:   none  Procedures:  ECHO and VQ are pending  Antimicrobials:  Anti-infectives (From admission, onward)   Start     Dose/Rate Route Frequency Ordered Stop   04/05/17 0200  cefTRIAXone (ROCEPHIN) 1 g in dextrose 5 % 50 mL IVPB  Status:  Discontinued     1 g 100 mL/hr over 30 Minutes Intravenous Every 24 hours 04/05/17 0124 04/05/17 0127   04/05/17 0200  ceFEPIme (MAXIPIME) 1 g in dextrose 5 % 50 mL IVPB     1 g 100 mL/hr over 30 Minutes Intravenous Every 12 hours 04/05/17 0131         Subjective:  Denies dyspnea at rest but has been more winded when ambulating the last few months.  Denies chest pains.  Having abdominal pains and pain when urinating.    Objective: Vitals:   04/05/17 0130 04/05/17 0200 04/05/17 0250 04/05/17  0614  BP: 120/75 (!) 139/94 (!) 152/95 126/62  Pulse: 84 84 99 71  Resp: (!) 22 (!) 21 (!) 21 16  Temp:   98.1 F (36.7 C) 97.7 F (36.5 C)  TempSrc:   Oral Oral  SpO2: 98% 98% 100% 99%  Weight:   91 kg (200 lb 9.9 oz)   Height:   5' 6"  (1.676 m)     Intake/Output Summary (Last 24 hours) at 04/05/2017 1339 Last data filed at 04/05/2017 0531 Gross per 24 hour  Intake -  Output 300 ml  Net -300 ml   Filed Weights   04/05/17 0250  Weight: 91 kg (200 lb 9.9 oz)    Examination:  General exam:  Adult male.  No acute distress.  HEENT:  NCAT, MMM Respiratory system: Clear to  auscultation bilaterally Cardiovascular system: Regular rate and rhythm, normal S1/S2. No murmurs, rubs, gallops or clicks.  Warm extremities Gastrointestinal system: Normal active bowel sounds, soft, nondistended, mild TTP over the suprapubic area without rebound or guarding.   MSK:  Normal tone and bulk, no lower extremity edema Neuro:  Grossly intact    Data Reviewed: I have personally reviewed following labs and imaging studies  CBC: Recent Labs  Lab 04/04/17 2353  WBC 6.7  NEUTROABS 4.4  HGB 15.7  HCT 44.9  MCV 93.9  PLT 160*   Basic Metabolic Panel: Recent Labs  Lab 04/04/17 2353 04/05/17 0509  NA 131* 127*  K 4.5 5.4*  CL 103 104  CO2 20* 18*  GLUCOSE 140* 116*  BUN 34* 28*  CREATININE 2.05* 1.57*  CALCIUM 8.9 7.9*   GFR: Estimated Creatinine Clearance: 45.6 mL/min (A) (by C-G formula based on SCr of 1.57 mg/dL (H)). Liver Function Tests: No results for input(s): AST, ALT, ALKPHOS, BILITOT, PROT, ALBUMIN in the last 168 hours. No results for input(s): LIPASE, AMYLASE in the last 168 hours. No results for input(s): AMMONIA in the last 168 hours. Coagulation Profile: No results for input(s): INR, PROTIME in the last 168 hours. Cardiac Enzymes: Recent Labs  Lab 04/05/17 0127  CKTOTAL 26*   BNP (last 3 results) No results for input(s): PROBNP in the last 8760 hours. HbA1C: No results for input(s): HGBA1C in the last 72 hours. CBG: No results for input(s): GLUCAP in the last 168 hours. Lipid Profile: No results for input(s): CHOL, HDL, LDLCALC, TRIG, CHOLHDL, LDLDIRECT in the last 72 hours. Thyroid Function Tests: No results for input(s): TSH, T4TOTAL, FREET4, T3FREE, THYROIDAB in the last 72 hours. Anemia Panel: No results for input(s): VITAMINB12, FOLATE, FERRITIN, TIBC, IRON, RETICCTPCT in the last 72 hours. Urine analysis:    Component Value Date/Time   COLORURINE AMBER (A) 04/05/2017 0019   APPEARANCEUR CLOUDY (A) 04/05/2017 0019   LABSPEC  1.025 04/05/2017 0019   PHURINE 5.0 04/05/2017 0019   GLUCOSEU NEGATIVE 04/05/2017 0019   HGBUR LARGE (A) 04/05/2017 0019   BILIRUBINUR NEGATIVE 04/05/2017 0019   KETONESUR NEGATIVE 04/05/2017 0019   PROTEINUR 100 (A) 04/05/2017 0019   NITRITE NEGATIVE 04/05/2017 0019   LEUKOCYTESUR MODERATE (A) 04/05/2017 0019   Sepsis Labs: @LABRCNTIP (procalcitonin:4,lacticidven:4)  ) Recent Results (from the past 240 hour(s))  Urine culture     Status: None   Collection Time: 03/26/17  9:00 PM  Result Value Ref Range Status   Specimen Description URINE, CLEAN CATCH  Final   Special Requests NONE  Final   Culture   Final    NO GROWTH Performed at [facility], [address] [place], [place] [address]    Report Status 03/28/2017 FINAL  Final      Radiology Studies: Dg Chest 2 View  Result Date: 04/04/2017 CLINICAL DATA:  Cough and shortness of breath for 1 month. EXAM: CHEST  2 VIEW COMPARISON:  None. FINDINGS: The heart size and mediastinal contours are within normal limits. Mild linear opacity of the left lung base. There is no pulmonary edema or pleural effusion. The visualized skeletal structures are unremarkable. IMPRESSION: Mild linear opacity of the left lung base favor linear atelectasis. Electronically Signed   By: Abelardo Diesel M.D.   On: 04/04/2017 19:27     Scheduled Meds: . calcium carbonate  1 tablet Oral Q breakfast  . enoxaparin (LOVENOX) injection  40 mg Subcutaneous Q24H  . multivitamin with minerals  1 tablet Oral Daily  . pentosan polysulfate  100 mg Oral TID  . QUEtiapine  300 mg Oral QHS  . rOPINIRole  1 mg Oral QHS  . rotigotine  1 patch Transdermal Daily  . tamsulosin  0.8 mg Oral QPC supper  . trihexyphenidyl  2 mg Oral QPM   Continuous Infusions: . sodium chloride 100 mL/hr at 04/05/17 1013  . ceFEPime (MAXIPIME) IV Stopped (04/05/17 0257)     LOS: 0 days    Time spent: 30 min    Janece Canterbury, MD [facility] Pager  [phone]  If 7PM-7AM, please contact night-coverage [email or web address] Password [email or web address] 04/05/2017, 1:39 PM

## 2017-04-05 NOTE — Progress Notes (Signed)
Pharmacy Antibiotic Note  Luke Bennett is a 72 y.o. male admitted on 04/04/2017 with UTI.  Pharmacy has been consulted for cefepime dosing.  Plan: Cefepime 1g IV q12 for CrCl 30-60 ml/min     Temp (24hrs), Avg:98.3 F (36.8 C), Min:98.3 F (36.8 C), Max:98.3 F (36.8 C)  Recent Labs  Lab 04/04/17 2353  WBC 6.7  CREATININE 2.05*    Estimated Creatinine Clearance: 35.7 mL/min (A) (by C-G formula based on SCr of 2.05 mg/dL (H)).    Allergies  Allergen Reactions  . Aspirin     Stomach bleeding   . Celecoxib     Stomach pain  . Sinemet [Carbidopa-Levodopa] Rash    Thank you for allowing pharmacy to be a part of this patient's care.   Adrian Saran, PharmD, BCPS Pager [phone] 04/05/2017 1:30 AM

## 2017-04-06 ENCOUNTER — Observation Stay (HOSPITAL_BASED_OUTPATIENT_CLINIC_OR_DEPARTMENT_OTHER): Payer: Medicare HMO

## 2017-04-06 DIAGNOSIS — I5030 Unspecified diastolic (congestive) heart failure: Secondary | ICD-10-CM | POA: Diagnosis not present

## 2017-04-06 DIAGNOSIS — R0602 Shortness of breath: Secondary | ICD-10-CM | POA: Diagnosis not present

## 2017-04-06 DIAGNOSIS — N179 Acute kidney failure, unspecified: Secondary | ICD-10-CM | POA: Diagnosis not present

## 2017-04-06 LAB — ECHOCARDIOGRAM COMPLETE
AOASC: 34 cm
E decel time: 176 msec
E/e' ratio: 7.07
FS: 38 % (ref 28–44)
HEIGHTINCHES: 66 in
IVS/LV PW RATIO, ED: 1
LA diam end sys: 32 mm
LA diam index: 1.53 cm/m2
LA vol A4C: 41.5 ml
LASIZE: 32 mm
LDCA: 4.15 cm2
LV E/e' medial: 7.07
LV PW d: 8.96 mm — AB (ref 0.6–1.1)
LV e' LATERAL: 8.16 cm/s
LVEEAVG: 7.07
LVOTD: 23 mm
Lateral S' vel: 16.9 cm/s
MV Dec: 176
MVPKAVEL: 98.5 m/s
MVPKEVEL: 57.7 m/s
RV TAPSE: 21.7 mm
TDI e' lateral: 8.16
TDI e' medial: 5.44
WEIGHTICAEL: 3209.9 [oz_av]

## 2017-04-06 LAB — CBC
HEMATOCRIT: 38.5 % — AB (ref 39.0–52.0)
Hemoglobin: 13.1 g/dL (ref 13.0–17.0)
MCH: 32.2 pg (ref 26.0–34.0)
MCHC: 34 g/dL (ref 30.0–36.0)
MCV: 94.6 fL (ref 78.0–100.0)
Platelets: 87 10*3/uL — ABNORMAL LOW (ref 150–400)
RBC: 4.07 MIL/uL — ABNORMAL LOW (ref 4.22–5.81)
RDW: 13.4 % (ref 11.5–15.5)
WBC: 3.6 10*3/uL — AB (ref 4.0–10.5)

## 2017-04-06 LAB — BASIC METABOLIC PANEL
Anion gap: 4 — ABNORMAL LOW (ref 5–15)
BUN: 18 mg/dL (ref 6–20)
CHLORIDE: 113 mmol/L — AB (ref 101–111)
CO2: 19 mmol/L — AB (ref 22–32)
CREATININE: 1.08 mg/dL (ref 0.61–1.24)
Calcium: 8.1 mg/dL — ABNORMAL LOW (ref 8.9–10.3)
GFR calc Af Amer: >60 mL/min (ref >60)
GFR calc non Af Amer: >60 mL/min (ref >60)
GLUCOSE: 94 mg/dL (ref 65–99)
POTASSIUM: 4.1 mmol/L (ref 3.5–5.1)
Sodium: 136 mmol/L (ref 135–145)

## 2017-04-06 LAB — URINE CULTURE: Culture: NO GROWTH

## 2017-04-06 MED ORDER — BELLADONNA ALKALOIDS-OPIUM 16.2-60 MG RE SUPP: 1.0000 | Freq: Three times a day (TID) | RECTAL | Status: DC | PRN

## 2017-04-06 MED ORDER — OXYBUTYNIN CHLORIDE 5 MG PO TABS: 5.0000 mg | ORAL_TABLET | Freq: Three times a day (TID) | ORAL | 0 refills | Status: DC | PRN

## 2017-04-06 MED ORDER — BELLADONNA ALKALOIDS-OPIUM 16.2-60 MG RE SUPP: 1.0000 | Freq: Three times a day (TID) | RECTAL | 0 refills | Status: DC | PRN

## 2017-04-06 MED ORDER — DEXTROSE 5 % IV SOLN
1.0000 g | Freq: Three times a day (TID) | INTRAVENOUS | Status: DC
  Administered 2017-04-06: 1 g via INTRAVENOUS
  Filled 2017-04-06 (×2): qty 1

## 2017-04-06 NOTE — Progress Notes (Signed)
  Echocardiogram 2D Echocardiogram has been performed.  Luke Bennett 04/06/2017, 10:10 AM

## 2017-04-06 NOTE — Progress Notes (Signed)
Pt is discharged to home. Dc instructions given with sister in law at bedside. No concerns voiced. Pt encouraged to stop by [facility] off [address] and pick up med that was e-prescribed by MD. Voiced understanding. Left unit in wheelchair pushed by nurse tech accompanied by nurse tech. Left in stable condition. Luke Bennett.

## 2017-04-06 NOTE — Discharge Instructions (Signed)

## 2017-04-06 NOTE — Progress Notes (Signed)
Pharmacy Antibiotic Note  Luke Bennett is a 72 y.o. male admitted on 04/04/2017 with UTI.  Pharmacy was consulted for cefepime dosing.  Today, 04/06/2017 Azotemia has resolved.  Estimated CrCl > 60 mL/min Remains afeb WBC slightly low   Plan: Increase cefepime to 1g IV q8h Follow culture results, renal function, clinical course  Height: 5' 6"  (167.6 cm) Weight: 200 lb 9.9 oz (91 kg) IBW/kg (Calculated) : 63.8  Temp (24hrs), Avg:98 F (36.7 C), Min:97.8 F (36.6 C), Max:98.2 F (36.8 C)  Recent Labs  Lab 04/04/17 2353 04/05/17 0509 04/06/17 0621  WBC 6.7  --  3.6*  CREATININE 2.05* 1.57* 1.08    Estimated Creatinine Clearance: 66.3 mL/min (by C-G formula based on SCr of 1.08 mg/dL).    Allergies  Allergen Reactions  . Aspirin     Stomach bleeding   . Bactrim [Sulfamethoxazole-Trimethoprim]     Skin rash  . Celecoxib     Stomach pain  . Sinemet [Carbidopa-Levodopa] Rash   Antimicrobials this admission:   1/12 >> cefepime   Dose adjustments this admission:  1/12 cefepime increased to 1 g q8h for CrCl > 60 mL/min  Microbiology results:  1/12 UCx: NGF  Thank you for allowing pharmacy to participate in this patient's care.  Clayburn Pert, PharmD, BCPS Pager: [phone] 04/06/2017  10:51 AM

## 2017-04-06 NOTE — Discharge Summary (Addendum)
Physician Discharge Summary  Luke Bennett PYK:[id number] DOB: 11/17/1945 DOA: 04/04/2017  PCP: Katherina Mires, MD  Admit date: 04/04/2017 Discharge date: 04/06/2017  Admitted From: home  Disposition:  home  Recommendations for Outpatient Follow-up:  1. Follow up with [facility] in 1-2 weeks for catheter removal 2. Follow up with PCP in 2 weeks for repeat BMP and CBC for AKI and thrombocytopenia  Home Health:  none  Equipment/Devices:  none  Discharge Condition:  Stable, improved CODE STATUS:  Full code  Diet recommendation:   Healthy heart   Brief/Interim Summary:  Luke Bennett a 72 y.o.malewith Crohn's disease, PE, MI, parkinson's disease, schizoaffective disorder. Patient presented to the ED with several week history of cough and SOB. Treated for bronchitis, symptoms improved. Intermittent cough and fever since that time. PCP worried about PE so sent patient in to ED.  Patient's primary concern is his dysuria.  The patient was seen on 1/2 for acute urinary retention.  He had a Foley catheter placed and was started on Flomax.  His urine culture from that date was negative but he has had worsening bladder spasms since placement of his catheter.  He has had intermittent hematuria with clots that have obstructed his catheter but which fortunately spontaneously improved.  He was given a prescription for Bactrim and he has completed his course, however a day or 2 into taking his medication he developed a diffuse erythematous full body rash.  His urine has remained cloudy, but urine culture was negative.  He has been given a prescription for oxybutynin.  His acute kidney injury was likely due to Bactrim and intermittent catheter obstruction and resolved.  His d-dimer was elevated but VQ scan was low risk.  ECHO demonstrated preserved EF and no evidence of diastolic dysfunction.  Unclear what is causing his dyspnea.  He will follow up with his PCP for ongoing work up.     Discharge  Diagnoses:  Principal Problem:   SOB (shortness of breath) Active Problems:   Parkinsonism (HCC)   AKI (acute kidney injury) ([place])   Catheter-associated urinary tract infection (HCC)   Schizoaffective disorder (HCC)  Dysuria/abdominal pain likely due to bladder spasms and intermittent obstruction of his catheter.  He had cloudy urine but no clots or obstruction during his stay.  He was started empirically on cefepime for possible UTI resistant to bactrim, but had no improvement in his symptoms and urine culture was ultimately negative.  Pentosan did not help.  He was given a prescription for oxybutynin for symptom relief until he can follow up with urology.  He does not have a rectal pouch so therefore bella-donna suppositories are not an option.    Acute urinary retention, continued foley and flomax.  flomax -  Follow up with Urology  AKI, creatinine trended down from 2.05 to 1.08 with IVF.  Suspect this was due to intermittent obstruction of his catheter and bactrim -  Bactrim discontinued  Hyponatremia, likely due to acute urinary obstruction and resolved  Macular full body rash, somewhat petechial on the legs, suspect drug rash, resolving by time of discharge -  D/c'd bactrim and added to allergy list  DOE, history of CAD and also has elevated D-dimer.  Patient denies dyspnea when at rest and currently on room air -  VQ scan negative -  ECHO with preserved EF and no evidence of diastolic dysfunction -  CXR demonstrated atelectasis:  Incentive spirometry  Crohn's disease, stable.  Has ostomy and takes Humira  Parkinson's with  anxiety and depression ("long psychiatric history" per Neurology notes) -  Continue artane (trihexyphenidyl), neupro  Schizoaffective disorder -  Continue seroquel (per notes, do NOT stop seroquel)  RLS, stable, continue requip  Mild leukocypenia and moderate thrombocytopenia -  Repeat CBC by PCP in 2 weeks and further work up if conditions  persist and not previously evaluated   Discharge Instructions  Discharge Instructions    Call MD for:  difficulty breathing, headache or visual disturbances   Complete by:  As directed    Call MD for:  hives   Complete by:  As directed    Call MD for:  severe uncontrolled pain   Complete by:  As directed    Diet - low sodium heart healthy   Complete by:  As directed    Increase activity slowly   Complete by:  As directed      Allergies as of 04/06/2017      Reactions   Aspirin    Stomach bleeding    Bactrim [sulfamethoxazole-trimethoprim]    Skin rash   Celecoxib    Stomach pain   Sinemet [carbidopa-levodopa] Rash      Medication List    TAKE these medications   benzonatate 100 MG capsule Commonly known as:  TESSALON Take 100 mg by mouth 2 (two) times daily as needed for cough.   calcium carbonate 600 MG Tabs tablet Commonly known as:  OS-CAL Take 600 mg by mouth daily with breakfast.   HUMIRA PEN 40 MG/0.8ML injection Generic drug:  adalimumab Inject 40 mg into the skin every 14 (fourteen) days. Due Monday     12/07/12   multivitamin with minerals Tabs tablet Take 1 tablet by mouth daily.   oxybutynin 5 MG tablet Commonly known as:  DITROPAN Take 1 tablet (5 mg total) by mouth every 8 (eight) hours as needed for bladder spasms.   QUEtiapine 300 MG tablet Commonly known as:  SEROQUEL Take 300 mg by mouth at bedtime.   rOPINIRole 0.5 MG tablet Commonly known as:  REQUIP Take up to 4 tablets at bedtime. What changed:    how much to take  how to take this  when to take this  additional instructions   rotigotine 6 MG/24HR Commonly known as:  [place] 1 patch onto the skin daily.   tamsulosin 0.4 MG Caps capsule Commonly known as:  FLOMAX Take 0.4 mg by mouth daily after supper.   trihexyphenidyl 2 MG tablet Commonly known as:  ARTANE Take 1 tablet (2 mg total) by mouth every evening.      Follow-up Information    Katherina Mires, MD.  Schedule an appointment as soon as possible for a visit in 2 week(s).   Specialty:  Family Medicine Why:  repeat blood work Contact information: [place] [address] [phone]        [place]. Schedule an appointment as soon as possible for a visit in 1 week(s).   Contact information: [place] [phone]         Allergies  Allergen Reactions  . Aspirin     Stomach bleeding   . Bactrim [Sulfamethoxazole-Trimethoprim]     Skin rash  . Celecoxib     Stomach pain  . Sinemet [Carbidopa-Levodopa] Rash    Consultations: none   Procedures/Studies: Dg Chest 2 View  Result Date: 04/04/2017 CLINICAL DATA:  Cough and shortness of breath for 1 month. EXAM: CHEST  2 VIEW COMPARISON:  None. FINDINGS: The heart size and mediastinal contours are within normal limits. Mild linear opacity of the left lung base. There is no pulmonary edema or pleural effusion. The visualized skeletal structures are unremarkable. IMPRESSION: Mild linear opacity of the left lung base favor linear atelectasis. Electronically Signed   By: Abelardo Diesel M.D.   On: 04/04/2017 19:27   US Renal  Result Date: 04/05/2017 CLINICAL DATA:  Flank pain and acute renal failure. EXAM: RENAL / URINARY TRACT ULTRASOUND COMPLETE COMPARISON:  None. FINDINGS: Right Kidney: Length: 10.7 cm. Small 5.3 mm nonobstructing calculus is identified. Echogenicity within normal limits. No mass or hydronephrosis visualized. Left Kidney: Length: 10.7 cm. Small 5.3 mm nonobstructing calculus is identified. Echogenicity within normal limits. No mass or hydronephrosis visualized. Bladder: Foley catheter is identified in decompressed bladder. IMPRESSION: Small nonobstructing stones in both kidneys. No hydronephrosis bilaterally. Electronically Signed   By: Abelardo Diesel M.D.   On: 04/05/2017 14:21   Nm Pulmonary Vent And Perf (v/q Scan)  Result Date:  04/05/2017 CLINICAL DATA:  One week history of shortness of breath. EXAM: NUCLEAR MEDICINE VENTILATION - PERFUSION LUNG SCAN TECHNIQUE: Ventilation images were obtained in multiple projections using inhaled aerosol Tc-75mDTPA. Perfusion images were obtained in multiple projections after intravenous injection of Tc-980mAA. RADIOPHARMACEUTICALS:  30.0 mCi Technetium-9981mPA aerosol inhalation and 4.0 mCi Technetium-48m87m IV COMPARISON:  Chest x-ray 04/04/2017 FINDINGS: Ventilation: Patchy heterogeneous ventilation scan with central deposition of the radiopharmaceutical. There is a peripheral wedge-shaped defect involving the right lower lobe. Perfusion: Matching defect in the right lower lobe. No other perfusion defects. IMPRESSION: Single matching segmental defect in the right lower lobe with a normal chest x-ray. This is a low probability V/Q scan for pulmonary embolism. Electronically Signed   By: P.  Marijo Sanes.   On: 04/05/2017 17:04      Subjective: Feeling well.  Denies SOB, chest pains.  Still having painful bladder spasms which are no better today.    Discharge Exam: Vitals:   04/06/17 0542 04/06/17 1339  BP: (!) 114/55 98/64  Pulse: 85 87  Resp: 20 18  Temp: 98.1 F (36.7 C) 99.2 F (37.3 C)  SpO2: 95% 96%   Vitals:   04/05/17 1345 04/05/17 2110 04/06/17 0542 04/06/17 1339  BP: 113/64 (!) 145/70 (!) 114/55 98/64  Pulse: 77 70 85 87  Resp: 18 16 20 18   Temp: 97.8 F (36.6 C) 98.2 F (36.8 C) 98.1 F (36.7 C) 99.2 F (37.3 C)  TempSrc: Oral Oral Oral Oral  SpO2: 98% 97% 95% 96%  Weight:      Height:        General: Pt is alert, awake, not in acute distress Cardiovascular: RRR, S1/S2 +, no rubs, no gallops Respiratory: CTA bilaterally, no wheezing, no rhonchi Abdominal: Soft, mild TTP in the suprapubic area without rebound or guarding, ND, bowel sounds +.  Liquid brown stool in ostomy in right lower quadrant Extremities: no edema, no cyanosis   The results of  significant diagnostics from this hospitalization (including imaging, microbiology, ancillary and laboratory) are listed below for reference.     Microbiology: Recent Results (from the past 240 hour(s))  Urine Culture     Status: None   Collection Time: 04/05/17 12:19 AM  Result Value Ref Range Status   Specimen Description URINE, RANDOM  Final   Special Requests NONE  Final   Culture   Final    NO GROWTH Performed at [facility]  Lab, [address], [place], [place] [address]    Report Status 04/06/2017 FINAL  Final     Labs: BNP (last 3 results) Recent Labs    04/04/17 2257  BNP 79.0   Basic Metabolic Panel: Recent Labs  Lab 04/04/17 2353 04/05/17 0509 04/06/17 0621  NA 131* 127* 136  K 4.5 5.4* 4.1  CL 103 104 113*  CO2 20* 18* 19*  GLUCOSE 140* 116* 94  BUN 34* 28* 18  CREATININE 2.05* 1.57* 1.08  CALCIUM 8.9 7.9* 8.1*   Liver Function Tests: No results for input(s): AST, ALT, ALKPHOS, BILITOT, PROT, ALBUMIN in the last 168 hours. No results for input(s): LIPASE, AMYLASE in the last 168 hours. No results for input(s): AMMONIA in the last 168 hours. CBC: Recent Labs  Lab 04/04/17 2353 04/06/17 0621  WBC 6.7 3.6*  NEUTROABS 4.4  --   HGB 15.7 13.1  HCT 44.9 38.5*  MCV 93.9 94.6  PLT 115* 87*   Cardiac Enzymes: Recent Labs  Lab 04/05/17 0127  CKTOTAL 26*   BNP: Invalid input(s): POCBNP CBG: No results for input(s): GLUCAP in the last 168 hours. D-Dimer Recent Labs    04/04/17 1750  DDIMER 4.46*   Hgb A1c No results for input(s): HGBA1C in the last 72 hours. Lipid Profile No results for input(s): CHOL, HDL, LDLCALC, TRIG, CHOLHDL, LDLDIRECT in the last 72 hours. Thyroid function studies No results for input(s): TSH, T4TOTAL, T3FREE, THYROIDAB in the last 72 hours.  Invalid input(s): FREET3 Anemia work up No results for input(s): VITAMINB12, FOLATE, FERRITIN, TIBC, IRON, RETICCTPCT in the last 72 hours. Urinalysis    Component Value  Date/Time   COLORURINE AMBER (A) 04/05/2017 0019   APPEARANCEUR CLOUDY (A) 04/05/2017 0019   LABSPEC 1.025 04/05/2017 0019   PHURINE 5.0 04/05/2017 0019   GLUCOSEU NEGATIVE 04/05/2017 0019   HGBUR LARGE (A) 04/05/2017 0019   BILIRUBINUR NEGATIVE 04/05/2017 0019   KETONESUR NEGATIVE 04/05/2017 0019   PROTEINUR 100 (A) 04/05/2017 0019   NITRITE NEGATIVE 04/05/2017 0019   LEUKOCYTESUR MODERATE (A) 04/05/2017 0019   Sepsis Labs Invalid input(s): PROCALCITONIN,  WBC,  LACTICIDVEN   Time coordinating discharge: Over 30 minutes  SIGNED:   Janece Canterbury, MD  [facility] 04/06/2017, 2:30 PM Pager   If 7PM-7AM, please contact night-coverage [email or web address] Password [email or web address]

## 2017-04-30 ENCOUNTER — Ambulatory Visit: Payer: Medicare HMO | Admitting: Neurology

## 2017-04-30 ENCOUNTER — Encounter: Payer: Self-pay | Admitting: Neurology

## 2017-04-30 VITALS — BP 126/78 | HR 102 | Ht 66.0 in | Wt 212.0 lb

## 2017-04-30 DIAGNOSIS — G2581 Restless legs syndrome: Secondary | ICD-10-CM

## 2017-04-30 DIAGNOSIS — D509 Iron deficiency anemia, unspecified: Secondary | ICD-10-CM

## 2017-04-30 DIAGNOSIS — T50905A Adverse effect of unspecified drugs, medicaments and biological substances, initial encounter: Secondary | ICD-10-CM

## 2017-04-30 DIAGNOSIS — G2571 Drug induced akathisia: Secondary | ICD-10-CM

## 2017-04-30 DIAGNOSIS — G253 Myoclonus: Secondary | ICD-10-CM

## 2017-04-30 DIAGNOSIS — M545 Low back pain, unspecified: Secondary | ICD-10-CM

## 2017-04-30 DIAGNOSIS — R292 Abnormal reflex: Secondary | ICD-10-CM | POA: Diagnosis not present

## 2017-04-30 DIAGNOSIS — G20 Parkinson's disease: Secondary | ICD-10-CM | POA: Diagnosis not present

## 2017-04-30 MED ORDER — ROPINIROLE HCL 0.5 MG PO TABS: 1.0000 mg | ORAL_TABLET | Freq: Every day | ORAL | 5 refills | Status: DC

## 2017-04-30 MED ORDER — ROTIGOTINE 6 MG/24HR TD PT24: 1.0000 | MEDICATED_PATCH | Freq: Every day | TRANSDERMAL | 12 refills | Status: DC

## 2017-04-30 NOTE — Patient Instructions (Signed)
Restless Legs Syndrome Restless legs syndrome is a condition that causes uncomfortable feelings or sensations in the legs, especially while sitting or lying down. The sensations usually cause an overwhelming urge to move the legs. The arms can also sometimes be affected. The condition can range from mild to severe. The symptoms often interfere with a person's ability to sleep. What are the causes? The cause of this condition is not known. What increases the risk? This condition is more likely to develop in:  People who are older than age 83.  Pregnant women. In general, restless legs syndrome is more common in women than in men.  People who have a family history of the condition.  People who have certain medical conditions, such as iron deficiency, kidney disease, Parkinson disease, or nerve damage.  People who take certain medicines, such as medicines for high blood pressure, nausea, colds, allergies, depression, and some heart conditions.  What are the signs or symptoms? The main symptom of this condition is uncomfortable sensations in the legs. These sensations may be:  Described as pulling, tingling, prickling, throbbing, crawling, or burning.  Worse while you are sitting or lying down.  Worse during periods of rest or inactivity.  Worse at night, often interfering with your sleep.  Accompanied by a very strong urge to move your legs.  Temporarily relieved by movement of your legs.  The sensations usually affect both sides of the body. The arms can also be affected, but this is rare. People who have this condition often have tiredness during the day because of their lack of sleep at night. How is this diagnosed? This condition may be diagnosed based on your description of the symptoms. You may also have tests, including blood tests, to check for other conditions that may lead to your symptoms. In some cases, you may be asked to spend some time in a sleep lab so your sleeping  can be monitored. How is this treated? Treatment for this condition is focused on managing the symptoms. Treatment may include:  Self-help and lifestyle changes.  Medicines.  Follow these instructions at home:  Take medicines only as directed by your health care provider.  Try these methods to get temporary relief from the uncomfortable sensations: ? Massage your legs. ? Walk or stretch. ? Take a cold or hot bath.  Practice good sleep habits. For example, go to bed and get up at the same time every day.  Exercise regularly.  Practice ways of relaxing, such as yoga or meditation.  Avoid caffeine and alcohol.  Do not use any tobacco products, including cigarettes, chewing tobacco, or electronic cigarettes. If you need help quitting, ask your health care provider.  Keep all follow-up visits as directed by your health care provider. This is important. Contact a health care provider if: Your symptoms do not improve with treatment, or they get worse. This information is not intended to replace advice given to you by your health care provider. Make sure you discuss any questions you have with your health care provider. Document Released: 03/01/2002 Document Revised: 08/17/2015 Document Reviewed: 03/07/2014 Elsevier Interactive Patient Education  [employer].

## 2017-04-30 NOTE — Progress Notes (Signed)
SLEEP MEDICINE CLINIC   Provider:  Larey Seat, [place] D  Primary Care Physician:  Katherina Mires, MD   Referring Provider: Katherina Mires, MD    Chief Complaint  Patient presents with  . Follow-up    pt with sister in law, rm 55. pt is struggling with the restless leg. he is questioning if needs increase. pt states having difficulty with sleep.    HPI:  Luke Bennett is a 72 y.o. male , seen here as in a referral from Dr. Desmond Dike for RLS follow up.  Mr. has had been tried on dopaminergic agonists in tablet form, for a brief while he was on carbidopa levodopa but this caused a rash, and he then switched to the new pro patch which provided the most relief.  He is currently taking ropinirole 0.5 mg at night up to 2 tablets by mouth, he is taking the Neupro patch at 6 mg. He reports that his restless legs have become worse again."They will not stop"he is not sure how many hours of sleep he actually gets, but the restless legs make it hard for him to fall asleep initially and stay asleep as well.  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  He is waering a  urostomy bag now, and reports the tubing hurts. He has not to wake up to urinate.  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  Sleep habits are as follows: The patient aims for a bedtime around 10 PM 30 minutes.  He waits to be tired enough to be more likely successfully entering sleep.  Once asleep he can sleep for 2-3 hours unless woken by external factors.  Once awake it is very hard for him to go back to sleep.  He sleeps on his side because of the urostomy tube and colostomy bag.  While he does not wake up from the urge to urinate he does feel pressure.  He always wakes up on his back. Sleeps on one pillow on a flat mattress.  The bedroom is cool quiet and dark.  He  rises usually at 4 oh 4:30 AM.  I estimate that the patient only gets about 5 hours of sleep at night if that.  Sleep medical history and family sleep history: see previous note. Colostomy, urostomy. RLS , PD/  Hoarse voice.   Social history:  Married, he worked for [employer], arts- Curator / receiving. 4 adult children.     CC: Parkinson's disease, Dr. Ferdinand Lango last 3 visit notes for this patient with RLS, confusion and PD.   Interval history: He is more confused. Has a hard time understanding things. Forgetting things he does every day. Walking slower. No hallucinations. No swallowing difficulties, no falls. Already had a sleep study.  Interval history: His tremor is doing well. He is slowing down. No new symptoms. He is on Artane. Will try 59m neupro patch. Discussed options, decided to increase patch, if he does well call for new prescription.  Interval history 08/01/2015: Patient is doing very well and Neupro patch and his tremor is much improved. He still has a very slight right hand resting tremor. He is doing much better on the neupro patch. Patient is slowing down. He feels quite fatigued. Feels as though he is walking slower. Discussed this today. We'll hold  off on increasing the Neupro patch. Try amantadine as this is use for fatigue and that is also used a tremor and Parkinson's. Discussed side effects.  NEUROIMAGING REPORT STUDY DATE: 10/06/2014 PATIENT NAME: Luke Bennett DOB: 1945/04/27 MRN: [id number]  EXAM: MRI Brain with and without contrast  ORDERING CLINICIAN: Sarina Ill M.D. CLINICAL HISTORY: 72 year old man with tremors, ataxia and memory loss COMPARISON FILMS: None  TECHNIQUE:MRI of the brain with and without contrast was obtained utilizing 5 mm axial slices with T1, T2, T2 flair, SWI and diffusion weighted views.  T1 sagittal, T2 coronal and postcontrast views in the axial and coronal plane were obtained. CONTRAST: 20 ml Mult[facility]ance IMAGING SITE: [facility] [place].  FINDINGS: On sagittal images, the spinal cord is imaged caudally to C3 and is normal in caliber.   The contents of the posterior fossa are of normal size and position.   The pituitary gland and optic chiasm appear normal.   There is mild cortical atrophy.   The ventricles are without distortion.  There are no abnormal extra-axial collections of fluid.    The cerebellum appears normal. Some T2/FLAIR hyperintense foci are noted within the pons.  The deep gray matter appears normal.  In the hemispheres, there are scattered T2/FLAIR hyperintense foci in the periventricular, deep and subcortical white matter..  The orbits appear normal.   The VIIth/VIIIth nerve complex appears normal.  The mastoid air cells appear normal.  The maxillary sinuses and some of the ethmoid air cells show mild mucoperiosteal thickening. The other paranasal sinuses appear normal.  Flow voids are identified within the major intracerebral arteries.   Mild dolichoectasia of the left vertebral artery is noted.  Diffusion weighted images are normal.  Susceptibility weighted images are normal.   After the infusion of contrast material, a normal enhancement pattern is noted.  IMPRESSION:  This is an abnormal MRI of the brain with and without contrast showing the following: 1.  Mild cortical atrophy and mild extent of white matter foci in the hemispheres and pons consistent with chronic microvascular ischemic change. 2.  Mild ethmoid and frontal chronic sinusitis. 3.  There are no acute findings.   INTERPRETING PHYSICIAN:  Richard A. Felecia Shelling, MD, PhD Certified in  Neuroimaging by [place] [facility] [employer] [id number]  Basic metabolic panel  Order: [id number]  Status:  Final result  Visible to patient:  No (Not Released)  Next appt:  None   Ref Range & Units 3wk ago  Sodium 135 - 145 mmol/L 136   Comment: DELTA CHECK NOTED  Potassium 3.5 - 5.1 mmol/L 4.1   Comment: DELTA CHECK NOTED    Chloride 101 - 111 mmol/L 113 Abnormally high    CO2 22 - 32 mmol/L 19 Abnormally low    Glucose, Bld 65 - 99 mg/dL 94   BUN 6 - 20 mg/dL 18   Creatinine, Ser 0.61 - 1.24 mg/dL 1.08   Calcium 8.9 - 10.3 mg/dL 8.1 Abnormally low    GFR calc non Af Amer >60 mL/min >60   GFR calc Af Amer >60 mL/min >60   Comment: (NOTE)  The eGFR has been calculated using the CKD EPI equation.  This calculation has not been validated in all clinical situations.  eGFR's persistently <60 mL/min signify possible Chronic Kidney  Disease.   Anion gap 5 - 15 4 Abnormally low    Resulting Agency  [facility]      Specimen Collected: 04/06/17 06:21 Last Resulted: 04/06/17 06:53  Lab Flowsheet    Order Details    View Encounter    Lab and Collection Details       Hospitalization in January 2018 for 5 days with in the [facility] system.  Basic metabolic panels were obtained as well as CBC and differential, I did not find an iron panel.   Review of Systems: Out of a complete 14 system review, the patient complains of only the following symptoms, and all other reviewed systems are negative. PD tremor, but ropinerol, neutro patch-  he reports that he easily dozes off during the day has kind of microsleep attacks, which can be promoted by dopaminergic agonists.  It would be hard for him to watch a whole TV show without dozing.[name]  Epworth score  7 , Fatigue severity score 20  , depression score n/a    Social History   Socioeconomic History  . Marital status: Married    Spouse name: Not on file  . Number of children: 4  . Years of education: Not on file  . Highest education level: Not on file  Social Needs  . Financial resource strain: Not on file  . Food insecurity - worry: Not on file  . Food insecurity - inability: Not on file  . Transportation needs - medical: Not on file  . Transportation needs - non-medical: Not on file  Occupational History  . Occupation: Retired  Tobacco Use  . Smoking  status: Former Smoker    Types: Cigarettes    Last attempt to quit: 1971    Years since quitting: 48.1  . Smokeless tobacco: Never Used  Substance and Sexual Activity  . Alcohol use: No    Alcohol/week: 0.0 oz  . Drug use: No  . Sexual activity: Not on file  Other Topics Concern  . Not on file  Social History Narrative   Lives at home with wife and in-laws, 6 people total.   Caffeine use: 1 cup Dr. Malachi Bonds "every 1.5 weeks"   Right handed       Family History  Problem Relation Age of Onset  . Cerebral aneurysm Father   . Hypertension Sister   . Hodgkin's lymphoma Sister   . Parkinsonism Neg Hx     Past Medical History:  Diagnosis Date  . Anxiety   . Crohn's disease ([place])   . Depression   . Fatty liver   . Heart attack ([place])   . Pulmonary embolism ([place])   . TIA (transient ischemic attack)     Past Surgical History:  Procedure Laterality Date  . ABDOMINAL SURGERY    . COLECTOMY    . OSTOMY    . STOMA DILATATION    . TONSILLECTOMY      Current Outpatient Medications  Medication Sig Dispense Refill  . adalimumab (HUMIRA PEN) 40 MG/0.8ML injection Inject 40 mg into the skin every 14 (fourteen) days. Due Monday     12/07/12    . calcium carbonate (OS-CAL) 600 MG TABS tablet Take 600 mg by mouth daily with breakfast.     . Multiple Vitamin (MULTIVITAMIN WITH MINERALS) TABS tablet Take 1 tablet by mouth daily.    [name] oxybutynin (DITROPAN) 5 MG tablet Take 1 tablet (5 mg total) by mouth every 8 (eight) hours as needed for bladder spasms. 90 tablet 0  . QUEtiapine (SEROQUEL) 300 MG tablet Take 300 mg by mouth at bedtime.   4  . rOPINIRole (REQUIP) 0.5 MG tablet Take up to 4 tablets at bedtime. (Patient taking  differently: Take 1 mg by mouth at bedtime. Take up to 4 tablets at bedtime.) 360 tablet 6  . rotigotine (NEUPRO) 6 MG/24HR Place 1 patch onto the skin daily. 30 patch 12  . tamsulosin (FLOMAX) 0.4 MG CAPS capsule Take 0.4 mg by mouth daily after supper.    .  trihexyphenidyl (ARTANE) 2 MG tablet Take 1 tablet (2 mg total) by mouth every evening. 30 tablet 11   No current facility-administered medications for this visit.     Allergies as of 04/30/2017 - Review Complete 04/30/2017  Allergen Reaction Noted  . Aspirin  12/04/2012  . Bactrim [sulfamethoxazole-trimethoprim]  04/05/2017  . Celecoxib  12/04/2012  . Sinemet [carbidopa-levodopa] Rash 03/01/2015    Vitals: BP 126/78   Pulse (!) 102   Ht 5' 6"  (1.676 m)   Wt 212 lb (96.2 kg)   BMI 34.22 kg/m  Last Weight:  Wt Readings from Last 1 Encounters:  04/30/17 212 lb (96.2 kg)   IZT:IWPY mass index is 34.22 kg/m.     Last Height:   Ht Readings from Last 1 Encounters:  04/30/17 5' 6"  (1.676 m)    Physical exam:  General: The patient is awake, alert and appears not in acute distress. The patient is well groomed. Head: Normocephalic, atraumatic. Neck is supple. Mallampati 3- lacquered tongue- IRON ?  ,  neck circumference:17'. Nasal airflow patent . Retrognathia is not seen.  Cardiovascular:  Regular rate and rhythm, without  murmurs or carotid bruit, and without distended neck veins. Respiratory: Lungs are clear to auscultation. Skin:  Without evidence of edema, or rash Trunk: BMI is normal  The patient's posture is stooped.  Neurologic exam :The patient is awake and alert, oriented to place and time.    Attention span & concentration ability appears normal.  Speech is fluent, without  dysarthria, dysphonia or aphasia.  Mood and affect are appropriate.  Cranial nerves: Pupils are equal and briskly reactive to light. Funduscopic exam without   evidence of pallor or edema. Extraocular movements  in vertical and horizontal planes intact and without nystagmus. Visual fields by finger perimetry are intact. Hearing deferred. Facial sensation intact to fine touch. Facial motor strength is symmetric , but there is a decreased mimic. his tongue and uvula move midline, there is a tongue  tremor. Shoulder shrug was symmetrical.  Motor exam:  Elevated  tone,  Cog wheeling , symmetric muscle bulk and symmetric strength in all extremities. He has equal grip strength.  Sensory:  Fine touch, pinprick and vibration were tested in all extremities. Proprioception tested in the upper extremities was normal. Coordination: Rapid alternating movements in the fingers/hands was normal. Finger-to-nose maneuver  normal without evidence of ataxia, dysmetria or tremor. Gait and station:deferred Deep tendon reflexes: in the upper and lower extremities are symmetric and intact. Patella is brisk.   Sleep study from 01/25/2015 documented 0.0 apneas but frequent periodic limb movements.  The arousal index due to periodic limb movements was 3.3/h but this does not describes a difficulties to go to sleep in the first place with restless leg symptoms.   Assessment:  After physical and neurologic examination, review of laboratory studies,  Personal review of imaging studies, reports of other /same  Imaging studies, results of polysomnography and / or neurophysiology testing and pre-existing records as far as provided in visit., my assessment is   1) Mr. Chavarin has a long-standing history of restless leg symptoms, the irresistible urge to move which keeps him from sleeping going to  sleep or staying asleep.  He had a sleep study in the past. His does have responded to dopaminergic agonists but he is literally at a higher dose than 90% of restless leg patients at this time.  6 mg of the neupro patch and 1 mg of oral ropinirole.   2) I would like to further investigate why his restless legs have resurged.  Since he has been hospitalized under the impression that he suffered a renal failure or may be just hydronephrosis he may have had additional metabolic problems contributing to the problem.    3)In addition I would like to check on his iron and total iron binding capacity and ferritin.  Any patient that has  undergone surgery and reports worsening of restless legs afterwards but could experience the symptoms because of blood loss and therefore iron loss.  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   4) I would like to order either a CT or an MRI of the lower spine, goal is to verify that he does not have thoracic or lumbar spinal stenosis.  He has brisk patellar reflexes with crossed adductor reflexes.    The patient was advised of the nature of the diagnosed disorder , the treatment options and the  risks for general health and wellness arising from not treating the condition.   I spent more than 50 minutes of face to face time with the patient.  Greater than 50% of time was spent in counseling and coordination of care. We have discussed the diagnosis and differential and I answered the patient's questions.    Plan:  Treatment plan and additional workup :  Ferritin, Fe and TIBC Thoracic and lumbar spine MRI. Non contrast  Refill  of current medications.  Microsleep attacks may be result of dopaminergic agonist  Rv with NP  or Dr Jaynee Eagles in 3 month    Joelle Roswell, MD 07/25/49, 1:02 PM  Certified in Neurology by ABPN Certified in [place] by [facility] [address], [place], [place] [address]

## 2017-05-01 LAB — IRON AND TIBC
IRON SATURATION: 30 % (ref 15–55)
Iron: 81 ug/dL (ref 38–169)
TIBC: 269 ug/dL (ref 250–450)
UIBC: 188 ug/dL (ref 111–343)

## 2017-05-01 LAB — FERRITIN: Ferritin: 139 ng/mL (ref 30–400)

## 2017-05-05 ENCOUNTER — Telehealth: Payer: Self-pay | Admitting: Neurology

## 2017-05-05 NOTE — Telephone Encounter (Signed)
Called and spoke with the patients wife. I went over the iron level and explained it was low. I informed the patient's wife that Dr Brett Fairy recommends the patient starts a multivitamin that has iron as well. She verbalized understanding.

## 2017-05-05 NOTE — Telephone Encounter (Signed)
-----   Message from Larey Seat, MD sent at 05/01/2017  1:30 PM EST ----- Low normal iron saturation, ferritin still pending. It would not hurt to use a multi mineral or prenatal vitamin with iron in it.   Cc Suzanna Obey, MD

## 2017-05-08 ENCOUNTER — Ambulatory Visit
Admission: RE | Admit: 2017-05-08 | Discharge: 2017-05-08 | Disposition: A | Discharge status: Home / Self Care | Payer: Medicare HMO | Source: Ambulatory Visit | Attending: Neurology | Admitting: Neurology

## 2017-05-08 DIAGNOSIS — G20A1 Parkinson's disease without dyskinesia, without mention of fluctuations: Secondary | ICD-10-CM

## 2017-05-08 DIAGNOSIS — D509 Iron deficiency anemia, unspecified: Secondary | ICD-10-CM

## 2017-05-08 DIAGNOSIS — R292 Abnormal reflex: Secondary | ICD-10-CM

## 2017-05-08 DIAGNOSIS — G20 Parkinson's disease: Secondary | ICD-10-CM | POA: Diagnosis not present

## 2017-05-08 DIAGNOSIS — G2581 Restless legs syndrome: Secondary | ICD-10-CM

## 2017-05-08 DIAGNOSIS — M545 Low back pain, unspecified: Secondary | ICD-10-CM

## 2017-05-15 ENCOUNTER — Telehealth: Payer: Self-pay | Admitting: Neurology

## 2017-05-15 NOTE — Telephone Encounter (Signed)
I have called and spoke with the patient and made him aware of the findings. The patient states that he doesn't feel that this is causing him any complications right now and does not want to proceed forward with any referrals or treatment for this. The patient does understand the findings. I have informed him that if he starts to experience any problems to call and [place] and at that time we can refer him to neurosurgery. Pt verbalized understanding.

## 2017-05-15 NOTE — Telephone Encounter (Signed)
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 This can impact the exiting nerve and cause sciatica.  Cc Suzanna Obey, MD

## 2017-05-30 ENCOUNTER — Telehealth: Payer: Self-pay | Admitting: Neurology

## 2017-05-30 NOTE — Telephone Encounter (Signed)
Pts wife called stating [employer] will be faxing over a pt assistance form and will need it fill out and sent back over as soon as possible. FYI

## 2017-06-02 ENCOUNTER — Other Ambulatory Visit: Payer: Self-pay | Admitting: *Deleted

## 2017-06-02 MED ORDER — ROTIGOTINE 6 MG/24HR TD PT24: 1.0000 | MEDICATED_PATCH | Freq: Every day | TRANSDERMAL | 12 refills | Status: DC

## 2017-06-02 NOTE — Telephone Encounter (Signed)
Prescription printed, ready for MD signature.

## 2017-06-02 NOTE — Telephone Encounter (Signed)
Printed, ready for MD signature.

## 2017-06-02 NOTE — Telephone Encounter (Signed)
Spoke with patient's wife again. Unable to locate form. She will have them fax the form again to the office.

## 2017-06-02 NOTE — Telephone Encounter (Signed)
Called the patient's wife back and informed her that we have not seen any paperwork yet. Will continue to look. Confirmed fax number with her. She will to have it faxed again.

## 2017-06-02 NOTE — Telephone Encounter (Signed)
Luke Bennett I filled  [facility] patient assistance form . Will you print Neupro 6 mg patch. And I will take care of the rest.  I called and spoke to patient's wife and relayed that Paper work was in process.

## 2017-06-02 NOTE — Telephone Encounter (Signed)
Patient's wife calling to see if patient assistance form for Dr. Jaynee Eagles to fill out was received.. She says needs back ASAP. Please call and advise.

## 2017-06-02 NOTE — Telephone Encounter (Signed)
Pts wife called stating fax back to [place] stating the fax # is on the bottom of the page that Dr. Jaynee Eagles needs to fill out but is [phone]

## 2017-06-09 ENCOUNTER — Encounter (HOSPITAL_BASED_OUTPATIENT_CLINIC_OR_DEPARTMENT_OTHER): Payer: Self-pay | Admitting: *Deleted

## 2017-06-09 ENCOUNTER — Other Ambulatory Visit: Payer: Self-pay | Admitting: Urology

## 2017-06-10 ENCOUNTER — Encounter (HOSPITAL_BASED_OUTPATIENT_CLINIC_OR_DEPARTMENT_OTHER): Payer: Self-pay | Admitting: *Deleted

## 2017-06-10 ENCOUNTER — Other Ambulatory Visit: Payer: Self-pay

## 2017-06-10 NOTE — Progress Notes (Signed)
SPOKE W/ PT WIFE VIA PHONE FOR PRE-OP INTERVIEW.  NPO AFTER MN W/ EXCEPTION CLEAR LIQUIDS UNTIL 0615 (NO CREAM/ MILK PRODUCTS).  ARRIVE AT 1015.  NEEDS ISTAT.  CURRENT EKG IN CHART AND Epic.

## 2017-06-16 ENCOUNTER — Ambulatory Visit (HOSPITAL_BASED_OUTPATIENT_CLINIC_OR_DEPARTMENT_OTHER): Payer: Medicare HMO | Admitting: Anesthesiology

## 2017-06-16 ENCOUNTER — Ambulatory Visit (HOSPITAL_BASED_OUTPATIENT_CLINIC_OR_DEPARTMENT_OTHER)
Admission: RE | Admit: 2017-06-16 | Discharge: 2017-06-16 | Disposition: A | Discharge status: Home / Self Care | Payer: Medicare HMO | Source: Ambulatory Visit | Attending: Urology | Admitting: Urology

## 2017-06-16 ENCOUNTER — Other Ambulatory Visit: Payer: Self-pay

## 2017-06-16 ENCOUNTER — Encounter (HOSPITAL_BASED_OUTPATIENT_CLINIC_OR_DEPARTMENT_OTHER): Payer: Self-pay | Admitting: *Deleted

## 2017-06-16 ENCOUNTER — Encounter (HOSPITAL_BASED_OUTPATIENT_CLINIC_OR_DEPARTMENT_OTHER)
Admission: RE | Disposition: A | Discharge status: Home / Self Care | Payer: Self-pay | Source: Ambulatory Visit | Attending: Urology

## 2017-06-16 DIAGNOSIS — Z86711 Personal history of pulmonary embolism: Secondary | ICD-10-CM | POA: Diagnosis not present

## 2017-06-16 DIAGNOSIS — I2510 Atherosclerotic heart disease of native coronary artery without angina pectoris: Secondary | ICD-10-CM | POA: Diagnosis not present

## 2017-06-16 DIAGNOSIS — Z8673 Personal history of transient ischemic attack (TIA), and cerebral infarction without residual deficits: Secondary | ICD-10-CM | POA: Diagnosis not present

## 2017-06-16 DIAGNOSIS — N401 Enlarged prostate with lower urinary tract symptoms: Secondary | ICD-10-CM | POA: Diagnosis present

## 2017-06-16 DIAGNOSIS — R339 Retention of urine, unspecified: Secondary | ICD-10-CM | POA: Insufficient documentation

## 2017-06-16 DIAGNOSIS — D696 Thrombocytopenia, unspecified: Secondary | ICD-10-CM | POA: Diagnosis not present

## 2017-06-16 DIAGNOSIS — R413 Other amnesia: Secondary | ICD-10-CM | POA: Insufficient documentation

## 2017-06-16 DIAGNOSIS — R269 Unspecified abnormalities of gait and mobility: Secondary | ICD-10-CM | POA: Insufficient documentation

## 2017-06-16 DIAGNOSIS — I252 Old myocardial infarction: Secondary | ICD-10-CM | POA: Diagnosis not present

## 2017-06-16 DIAGNOSIS — Z882 Allergy status to sulfonamides status: Secondary | ICD-10-CM | POA: Insufficient documentation

## 2017-06-16 DIAGNOSIS — N183 Chronic kidney disease, stage 3 (moderate): Secondary | ICD-10-CM | POA: Insufficient documentation

## 2017-06-16 DIAGNOSIS — Z86718 Personal history of other venous thrombosis and embolism: Secondary | ICD-10-CM | POA: Diagnosis not present

## 2017-06-16 DIAGNOSIS — K219 Gastro-esophageal reflux disease without esophagitis: Secondary | ICD-10-CM | POA: Diagnosis not present

## 2017-06-16 DIAGNOSIS — G2581 Restless legs syndrome: Secondary | ICD-10-CM | POA: Diagnosis not present

## 2017-06-16 DIAGNOSIS — G20 Parkinson's disease: Secondary | ICD-10-CM | POA: Diagnosis not present

## 2017-06-16 DIAGNOSIS — F329 Major depressive disorder, single episode, unspecified: Secondary | ICD-10-CM | POA: Diagnosis not present

## 2017-06-16 DIAGNOSIS — Z87891 Personal history of nicotine dependence: Secondary | ICD-10-CM | POA: Diagnosis not present

## 2017-06-16 DIAGNOSIS — L309 Dermatitis, unspecified: Secondary | ICD-10-CM | POA: Diagnosis not present

## 2017-06-16 DIAGNOSIS — K760 Fatty (change of) liver, not elsewhere classified: Secondary | ICD-10-CM | POA: Diagnosis not present

## 2017-06-16 DIAGNOSIS — R7303 Prediabetes: Secondary | ICD-10-CM | POA: Diagnosis not present

## 2017-06-16 DIAGNOSIS — D61818 Other pancytopenia: Secondary | ICD-10-CM | POA: Diagnosis not present

## 2017-06-16 DIAGNOSIS — Z888 Allergy status to other drugs, medicaments and biological substances status: Secondary | ICD-10-CM | POA: Insufficient documentation

## 2017-06-16 DIAGNOSIS — F419 Anxiety disorder, unspecified: Secondary | ICD-10-CM | POA: Insufficient documentation

## 2017-06-16 DIAGNOSIS — Z79899 Other long term (current) drug therapy: Secondary | ICD-10-CM | POA: Insufficient documentation

## 2017-06-16 DIAGNOSIS — Z881 Allergy status to other antibiotic agents status: Secondary | ICD-10-CM | POA: Insufficient documentation

## 2017-06-16 DIAGNOSIS — M1990 Unspecified osteoarthritis, unspecified site: Secondary | ICD-10-CM | POA: Diagnosis not present

## 2017-06-16 DIAGNOSIS — I129 Hypertensive chronic kidney disease with stage 1 through stage 4 chronic kidney disease, or unspecified chronic kidney disease: Secondary | ICD-10-CM | POA: Diagnosis not present

## 2017-06-16 DIAGNOSIS — Z9889 Other specified postprocedural states: Secondary | ICD-10-CM | POA: Insufficient documentation

## 2017-06-16 DIAGNOSIS — F259 Schizoaffective disorder, unspecified: Secondary | ICD-10-CM | POA: Insufficient documentation

## 2017-06-16 DIAGNOSIS — Z807 Family history of other malignant neoplasms of lymphoid, hematopoietic and related tissues: Secondary | ICD-10-CM | POA: Insufficient documentation

## 2017-06-16 DIAGNOSIS — K5090 Crohn's disease, unspecified, without complications: Secondary | ICD-10-CM | POA: Insufficient documentation

## 2017-06-16 DIAGNOSIS — Z8249 Family history of ischemic heart disease and other diseases of the circulatory system: Secondary | ICD-10-CM | POA: Insufficient documentation

## 2017-06-16 DIAGNOSIS — Z886 Allergy status to analgesic agent status: Secondary | ICD-10-CM | POA: Insufficient documentation

## 2017-06-16 DIAGNOSIS — Z9049 Acquired absence of other specified parts of digestive tract: Secondary | ICD-10-CM | POA: Insufficient documentation

## 2017-06-16 HISTORY — DX: Chronic kidney disease, stage 3 (moderate): N18.3

## 2017-06-16 HISTORY — DX: Other obstructive and reflux uropathy: N13.8

## 2017-06-16 HISTORY — DX: Benign prostatic hyperplasia with lower urinary tract symptoms: N40.1

## 2017-06-16 HISTORY — DX: Thrombocytopenia, unspecified: D69.6

## 2017-06-16 HISTORY — DX: Gastro-esophageal reflux disease without esophagitis: K21.9

## 2017-06-16 HISTORY — DX: Adverse effect of unspecified drugs, medicaments and biological substances, initial encounter: D75.9

## 2017-06-16 HISTORY — DX: Unspecified abnormalities of gait and mobility: R26.9

## 2017-06-16 HISTORY — DX: Tremor, unspecified: R25.1

## 2017-06-16 HISTORY — DX: Personal history of pulmonary embolism: Z86.711

## 2017-06-16 HISTORY — DX: Ileostomy status: Z93.2

## 2017-06-16 HISTORY — DX: Schizoaffective disorder, unspecified: F25.9

## 2017-06-16 HISTORY — DX: Fatty (change of) liver, not elsewhere classified: K76.0

## 2017-06-16 HISTORY — DX: Unspecified osteoarthritis, unspecified site: M19.90

## 2017-06-16 HISTORY — DX: Presence of other specified devices: Z97.8

## 2017-06-16 HISTORY — DX: Other drug-induced pancytopenia: D61.811

## 2017-06-16 HISTORY — DX: Retention of urine, unspecified: R33.9

## 2017-06-16 HISTORY — PX: CYSTOSCOPY WITH INSERTION OF UROLIFT: SHX6678

## 2017-06-16 HISTORY — DX: Adverse effect of unspecified drugs, medicaments and biological substances, initial encounter: T50.905A

## 2017-06-16 HISTORY — DX: Restless legs syndrome: G25.81

## 2017-06-16 HISTORY — DX: Iron deficiency anemia, unspecified: D50.9

## 2017-06-16 HISTORY — DX: Atherosclerotic heart disease of native coronary artery without angina pectoris: I25.10

## 2017-06-16 HISTORY — DX: Old myocardial infarction: I25.2

## 2017-06-16 HISTORY — DX: Chronic kidney disease, stage 3 unspecified: N18.30

## 2017-06-16 HISTORY — DX: Prediabetes: R73.03

## 2017-06-16 HISTORY — DX: Dermatitis, unspecified: L30.9

## 2017-06-16 HISTORY — DX: Presence of urogenital implants: Z96.0

## 2017-06-16 HISTORY — DX: Parkinson's disease without dyskinesia, without mention of fluctuations: G20.A1

## 2017-06-16 HISTORY — DX: Personal history of transient ischemic attack (TIA), and cerebral infarction without residual deficits: Z86.73

## 2017-06-16 HISTORY — DX: Personal history of other venous thrombosis and embolism: Z86.718

## 2017-06-16 HISTORY — DX: Calculus of kidney: N20.0

## 2017-06-16 HISTORY — DX: Presence of spectacles and contact lenses: Z97.3

## 2017-06-16 HISTORY — DX: Parkinson's disease: G20

## 2017-06-16 LAB — POCT I-STAT 4, (NA,K, GLUC, HGB,HCT)
Glucose, Bld: 109 mg/dL — ABNORMAL HIGH (ref 65–99)
HCT: 43 % (ref 39.0–52.0)
Hemoglobin: 14.6 g/dL (ref 13.0–17.0)
Potassium: 4.7 mmol/L (ref 3.5–5.1)
Sodium: 142 mmol/L (ref 135–145)

## 2017-06-16 SURGERY — CYSTOSCOPY WITH INSERTION OF UROLIFT
Anesthesia: Monitor Anesthesia Care

## 2017-06-16 MED ORDER — KETOROLAC TROMETHAMINE 30 MG/ML IJ SOLN
30.0000 mg | Freq: Once | INTRAMUSCULAR | Status: DC | PRN
  Filled 2017-06-16: qty 1

## 2017-06-16 MED ORDER — TRAMADOL HCL 50 MG PO TABS: 50.0000 mg | ORAL_TABLET | Freq: Four times a day (QID) | ORAL | 0 refills | Status: AC | PRN

## 2017-06-16 MED ORDER — MEPERIDINE HCL 25 MG/ML IJ SOLN
6.2500 mg | INTRAMUSCULAR | Status: DC | PRN
  Filled 2017-06-16: qty 1

## 2017-06-16 MED ORDER — OXYCODONE HCL 5 MG/5ML PO SOLN
5.0000 mg | Freq: Once | ORAL | Status: DC | PRN
  Filled 2017-06-16: qty 5

## 2017-06-16 MED ORDER — FENTANYL CITRATE (PF) 100 MCG/2ML IJ SOLN
INTRAMUSCULAR | Status: DC | PRN
  Administered 2017-06-16 (×2): 25 ug via INTRAVENOUS
  Administered 2017-06-16: 50 ug via INTRAVENOUS

## 2017-06-16 MED ORDER — CEFAZOLIN SODIUM-DEXTROSE 2-4 GM/100ML-% IV SOLN
2.0000 g | INTRAVENOUS | Status: AC
  Administered 2017-06-16: 2 g via INTRAVENOUS
  Filled 2017-06-16: qty 100

## 2017-06-16 MED ORDER — ACETAMINOPHEN 160 MG/5ML PO SOLN
325.0000 mg | ORAL | Status: DC | PRN
  Filled 2017-06-16: qty 20.3

## 2017-06-16 MED ORDER — STERILE WATER FOR IRRIGATION IR SOLN
Status: DC | PRN
  Administered 2017-06-16: 3000 mL

## 2017-06-16 MED ORDER — PROPOFOL 10 MG/ML IV BOLUS
INTRAVENOUS | Status: DC | PRN
  Administered 2017-06-16: 30 mg via INTRAVENOUS

## 2017-06-16 MED ORDER — FENTANYL CITRATE (PF) 100 MCG/2ML IJ SOLN
25.0000 ug | INTRAMUSCULAR | Status: DC | PRN
  Filled 2017-06-16: qty 1

## 2017-06-16 MED ORDER — FENTANYL CITRATE (PF) 100 MCG/2ML IJ SOLN
INTRAMUSCULAR | Status: AC
  Filled 2017-06-16: qty 2

## 2017-06-16 MED ORDER — PROPOFOL 500 MG/50ML IV EMUL
INTRAVENOUS | Status: DC | PRN
  Administered 2017-06-16: 150 ug/kg/min via INTRAVENOUS

## 2017-06-16 MED ORDER — ONDANSETRON HCL 4 MG/2ML IJ SOLN
4.0000 mg | Freq: Once | INTRAMUSCULAR | Status: DC | PRN
  Filled 2017-06-16: qty 2

## 2017-06-16 MED ORDER — ACETAMINOPHEN 325 MG PO TABS
325.0000 mg | ORAL_TABLET | ORAL | Status: DC | PRN
  Filled 2017-06-16: qty 2

## 2017-06-16 MED ORDER — OXYCODONE HCL 5 MG PO TABS
5.0000 mg | ORAL_TABLET | Freq: Once | ORAL | Status: DC | PRN
  Filled 2017-06-16: qty 1

## 2017-06-16 MED ORDER — PROPOFOL 10 MG/ML IV BOLUS
INTRAVENOUS | Status: AC
  Filled 2017-06-16: qty 20

## 2017-06-16 MED ORDER — CEFAZOLIN SODIUM-DEXTROSE 2-4 GM/100ML-% IV SOLN
INTRAVENOUS | Status: AC
  Filled 2017-06-16: qty 100

## 2017-06-16 MED ORDER — SODIUM CHLORIDE 0.9 % IV SOLN
INTRAVENOUS | Status: DC
  Administered 2017-06-16: 11:00:00 via INTRAVENOUS
  Filled 2017-06-16: qty 1000

## 2017-06-16 SURGICAL SUPPLY — 15 items
BAG DRAIN URO-CYSTO SKYTR STRL (DRAIN) ×2 IMPLANT
BAG URINE DRAINAGE (UROLOGICAL SUPPLIES) ×2 IMPLANT
CATH FOLEY 2WAY SLVR  5CC 16FR (CATHETERS) ×1
CATH FOLEY 2WAY SLVR 5CC 16FR (CATHETERS) ×1 IMPLANT
CLOTH BEACON ORANGE TIMEOUT ST (SAFETY) ×2 IMPLANT
GLOVE BIO SURGEON STRL SZ8 (GLOVE) ×2 IMPLANT
GOWN STRL REUS W/TWL XL LVL3 (GOWN DISPOSABLE) ×2 IMPLANT
HOLDER FOLEY CATH W/STRAP (MISCELLANEOUS) ×2 IMPLANT
IV NS IRRIG 3000ML ARTHROMATIC (IV SOLUTION) IMPLANT
MANIFOLD NEPTUNE II (INSTRUMENTS) ×2 IMPLANT
PACK CYSTO (CUSTOM PROCEDURE TRAY) ×2 IMPLANT
SYSTEM UROLIFT (Male Continence) ×12 IMPLANT
TUBE CONNECTING 12X1/4 (SUCTIONS) ×2 IMPLANT
WATER STERILE IRR 3000ML UROMA (IV SOLUTION) ×2 IMPLANT
WATER STERILE IRR 500ML POUR (IV SOLUTION) ×2 IMPLANT

## 2017-06-16 NOTE — Discharge Instructions (Signed)

## 2017-06-16 NOTE — H&P (Signed)
Urology Admission H&P  Chief Complaint: urinary retention  History of Present Illness: Luke Bennett is a 72yo with BPH and urinary retention. He has failed medical therapy. He presents today for Urolift  Past Medical History:  Diagnosis Date  . Anxiety   . Arthritis   . BPH with urinary obstruction   . CKD (chronic kidney disease), stage III ([place])   . Coronary artery disease    cardiologist-  dr Atilano Median [facility] cardiology [facility]) per lov note dated 05/30/2016  pt hx MI approx. 2001 had cardiac cath in [place] native vessel disease no intervention   . Crohn's disease ([place])    followed by dr c. Cindee Salt (digestive specialist w/ [facility] in [place])-- dx approx. 2003 s/p  proctocolecotmy w/ ostomy and colectomy in summer 2012 for bowel obstruction;  treated w/ Humira injection's  . Depression   . Drug-induced cytopenia    hx previous treatment w/ 6MP  . Drug-induced pancytopenia (H[facility])    hx previous treatment w/ 6MP  . Eczema   . Foley catheter in place   . Gait disturbance    secondary to parkinson's  . GERD (gastroesophageal reflux disease)   . History of DVT (deep vein thrombosis)    post op surgery 2012-- upper extremity--  treated w/ coumadin 6 months  . History of MI (myocardial infarction) 2001  . History of pulmonary embolus (PE)    2003 and 2012-- post surgery --  treated w/ coumadin for 6 months  . History of transient ischemic attack (TIA)    2012--- no residual  . Ileostomy in place [facility])   . Iron deficiency anemia   . NAFL (nonalcoholic fatty liver)    followed by dr c. Cindee Salt (digestive specialist w/ [facility])  lov note in epic dated 11-20-2016,  stage 0 fibriosis  . Nephrolithiasis    bilateral nonobstructive stones for Renal US dated 04-05-2017  . Parkinson's disease [facility])    neurologist-  dr Bethena Midget ([place] [facility])  . Pre-diabetes   . RLS (restless legs syndrome)   . Schizoaffective disorder ([place])   . Thrombocytopenia ([place]) followed by pcp, dr  briscoe   chronic -- secondary to 6-MP treatement's and Humira  . Tremor    secondary to parkinson's  . Urinary retention   . Wears glasses    Past Surgical History:  Procedure Laterality Date  . ABDOMINOPERINEAL PROCTOCOLECTOMY  2003   W/  CREATION OSTOMY  . CARDIAC CATHETERIZATION  2001 approx. in [place]   native vessel disease (per dr Atilano Median note dated 05-30-2016 , cardiologist), in epic)  . COLECTOMY  summer 2012  . TONSILLECTOMY  child  . TRANSTHORACIC ECHOCARDIOGRAM  ECHO 03-27-2017   ef 75-64%, grade 1 diastolic dysfunction    Home Medications:  Current Facility-Administered Medications  Medication Dose Route Frequency Provider Last Rate Last Dose  . 0.9 %  sodium chloride infusion   Intravenous Continuous Janeece Riggers, MD 50 mL/hr at 06/16/17 1044    . ceFAZolin (ANCEF) IVPB 2g/100 mL premix  2 g Intravenous 30 min Pre-Op McKenzie, Candee Furbish, MD       Allergies:  Allergies  Allergen Reactions  . Aspirin Other (See Comments)    HX Stomach bleeding   . Celecoxib Other (See Comments)    Stomach pain  . Bactrim [Sulfamethoxazole-Trimethoprim] Rash    Skin rash  . Sinemet [Carbidopa-Levodopa] Rash    Family History  Problem Relation Age of Onset  . Cerebral aneurysm Father   . Hypertension Sister   .  Hodgkin's lymphoma Sister   . Parkinsonism Neg Hx    Social History:  reports that he quit smoking about 48 years ago. His smoking use included cigarettes. He quit after 2.00 years of use. He has never used smokeless tobacco. He reports that he does not drink alcohol or use drugs.  Review of Systems  Genitourinary: Positive for frequency and urgency.  All other systems reviewed and are negative.   Physical Exam:  Vital signs in last 24 hours: Temp:  [98.6 F (37 C)] 98.6 F (37 C) (03/25 0954) Pulse Rate:  [100] 100 (03/25 0954) Resp:  [18] 18 (03/25 0954) BP: (130)/(72) 130/72 (03/25 0954) SpO2:  [98 %] 98 % (03/25 0954) Weight:  [96.9 kg (213 lb 11.2  oz)] 96.9 kg (213 lb 11.2 oz) (03/25 0954) Physical Exam  Constitutional: He is oriented to person, place, and time. He appears well-developed and well-nourished.  HENT:  Head: Normocephalic and atraumatic.  Eyes: Pupils are equal, round, and reactive to light. EOM are normal.  Neck: Normal range of motion. No thyromegaly present.  Cardiovascular: Normal rate and regular rhythm.  Respiratory: Effort normal. No respiratory distress.  GI: Soft. He exhibits no distension.  Musculoskeletal: Normal range of motion. He exhibits no edema.  Neurological: He is alert and oriented to person, place, and time.  Skin: Skin is warm and dry.  Psychiatric: He has a normal mood and affect. His behavior is normal. Judgment and thought content normal.    Laboratory Data:  Results for orders placed or performed during the hospital encounter of 06/16/17 (from the past 24 hour(s))  I-STAT 4, (NA,K, GLUC, HGB,HCT)     Status: Abnormal   Collection Time: 06/16/17 10:42 AM  Result Value Ref Range   Sodium 142 135 - 145 mmol/L   Potassium 4.7 3.5 - 5.1 mmol/L   Glucose, Bld 109 (H) 65 - 99 mg/dL   HCT 43.0 39.0 - 52.0 %   Hemoglobin 14.6 13.0 - 17.0 g/dL   No results found for this or any previous visit (from the past 240 hour(s)). Creatinine: No results for input(s): CREATININE in the last 168 hours. Baseline Creatinine: unknown  Impression/Assessment:  72yo with BPH with urinary retention  Plan:  The risks/benefits/alterantives to Urolift implantation was explained to the patient and he understands and wishes to proceed with surgery  Nicolette Bang 06/16/2017, 12:01 PM

## 2017-06-16 NOTE — Anesthesia Preprocedure Evaluation (Addendum)
Anesthesia Evaluation  Patient identified by MRN, date of birth, ID band Patient awake    Reviewed: Allergy & Precautions, NPO status , Patient's Chart, lab work & pertinent test results  Airway Mallampati: II  TM Distance: >3 FB Neck ROM: Full    Dental no notable dental hx.    Pulmonary neg pulmonary ROS, former smoker,    Pulmonary exam normal breath sounds clear to auscultation       Cardiovascular + CAD and + Past MI  Normal cardiovascular exam Rhythm:Regular Rate:Normal  ECHO 1/19 Study Conclusions  - Left ventricle: The cavity size was normal. Wall thickness was   normal. Systolic function was normal. The estimated ejection   fraction was in the range of 60% to 65%. Wall motion was normal;   there were no regional wall motion abnormalities. Doppler   parameters are consistent with abnormal left ventricular   relaxation (grade 1 diastolic dysfunction).   Neuro/Psych Schizophrenia Parkinsonism  Parkinson's disease  Tremor Ataxia Memory loss   TIA   GI/Hepatic negative GI ROS, Neg liver ROS,   Endo/Other  negative endocrine ROS  Renal/GU Renal disease  negative genitourinary   Musculoskeletal negative musculoskeletal ROS (+)   Abdominal   Peds negative pediatric ROS (+)  Hematology negative hematology ROS (+) anemia ,   Anesthesia Other Findings   Reproductive/Obstetrics negative OB ROS                             Anesthesia Physical Anesthesia Plan  ASA: III  Anesthesia Plan: MAC   Post-op Pain Management:    Induction: Intravenous  PONV Risk Score and Plan: 2 and Ondansetron and Treatment may vary due to age or medical condition  Airway Management Planned: Natural Airway, Nasal Cannula and Mask  Additional Equipment:   Intra-op Plan:   Post-operative Plan:   Informed Consent: I have reviewed the patients History and Physical, chart, labs and discussed the  procedure including the risks, benefits and alternatives for the proposed anesthesia with the patient or authorized representative who has indicated his/her understanding and acceptance.     Plan Discussed with: CRNA, Surgeon and Anesthesiologist  Anesthesia Plan Comments: ( )       Anesthesia Quick Evaluation

## 2017-06-16 NOTE — Anesthesia Postprocedure Evaluation (Signed)
Anesthesia Post Note  Patient: Luke Bennett  Procedure(s) Performed: CYSTOSCOPY WITH INSERTION OF UROLIFT (N/A )     Patient location during evaluation: PACU Anesthesia Type: MAC Level of consciousness: awake and alert Pain management: pain level controlled Vital Signs Assessment: post-procedure vital signs reviewed and stable Respiratory status: spontaneous breathing, nonlabored ventilation, respiratory function stable and patient connected to nasal cannula oxygen Cardiovascular status: stable and blood pressure returned to baseline Postop Assessment: no apparent nausea or vomiting Anesthetic complications: no    Last Vitals:  Vitals:   06/16/17 1300 06/16/17 1315  BP: 140/77 (!) 156/96  Pulse: 82 78  Resp: 13 17  Temp:    SpO2: 96% 98%    Last Pain:  Vitals:   06/16/17 1315  TempSrc:   PainSc: 0-No pain                 Haddon Fyfe

## 2017-06-16 NOTE — Transfer of Care (Signed)
Immediate Anesthesia Transfer of Care Note  Patient: Luke Bennett  Procedure(s) Performed: Procedure(s) (LRB): CYSTOSCOPY WITH INSERTION OF UROLIFT (N/A)  Patient Location: PACU  Anesthesia Type: General  Level of Consciousness: awake, oriented, sedated and patient cooperative  Airway & Oxygen Therapy: Patient Spontanous Breathing and Patient connected to face mask oxygen  Post-op Assessment: Report given to PACU RN and Post -op Vital signs reviewed and stable  Post vital signs: Reviewed and stable  Complications: No apparent anesthesia complications Last Vitals:  Vitals Value Taken Time  BP    Temp    Pulse 87 06/16/2017 12:55 PM  Resp 18 06/16/2017 12:55 PM  SpO2 97 % 06/16/2017 12:55 PM  Vitals shown include unvalidated device data.  Last Pain:  Vitals:   06/16/17 1013  TempSrc:   PainSc: 0-No pain      Patients Stated Pain Goal: 3 (06/16/17 1013)

## 2017-06-16 NOTE — Op Note (Signed)
   PREOPERATIVE DIAGNOSIS: Benign prostatic hypertrophy with  Urinary retention  POSTOPERATIVE DIAGNOSIS: same  PROCEDURE: Cystoscopy with implantation of UroLift devices, 6 implants.  SURGEON: Nicolette Bang, M.D.  ANESTHESIA: General  ANTIBIOTICS: ancef  SPECIMEN: None.  DRAINS: A 16-French Foley catheter.  BLOOD LOSS: Minimal.  COMPLICATIONS: None.  FINDINGS: no median lobe. No masses/lesions in the bladder. Open channel after 6 implants placed  INDICATIONS:The Patient is an 72 year old white male with BPH and urinary retention. He has failed medical therapy and has elected UroLift for definitive treatment.  FINDINGS OF PROCEDURE: He was taken to the operating room where a genral anesthetic was induced. He was placed in lithotomy position and was fitted with PAS hose. His perineum and genitalia were prepped with chlorhexidine, and he was draped in usual sterile fashion.  Cystoscopy was performed using the UroLift scope and 0 degree lens. Examination revealed a normal urethra. The external sphincter was intact. Prostatic urethra was approximately 5 cm in length with lateral lobe enlargement. There was also little bit of bladder neck elevation. Inspection of bladder revealed mild-to-moderate trabeculation with no tumors, stones, or inflammation. No cellules or diverticula were noted. Ureteral orifices were in their normal anatomic position effluxing clear urine.  After initial cystoscopy, the visual obturator was replaced with the first UroLift device. This was turned to the 9 o'clock position and pulled back to the veru and then slightly advanced. Pressure was then applied to the right lateral lobe and the UroLift device was deployed.  The second UroLift device was then inserted and applied to the left lateral lobe at 3 o'clock and deployed in the mid prostatic urethra. After this, there was still some apparent obstruction closer to the bladder  neck. So a second level of UroLift your left device was applied between the mid urethra and the proximal urethra providing further patency to the prostatic urethra. At this point, there was mild bleeding but the patient did have a spinal anesthetic. So it was thought that a Foley catheter was indicated. The scope was removed and a 16-French Foley catheter was inserted without difficulty. The balloon was filled with 10 mL sterile fluid, and the catheter was placed to straight drainage.  COMPLICATIONS: None   CONDITION: Stable, extubated, transferred to PACU  PLAN: The patient will be discharged home and followup in 2 days for a voiding trial.

## 2017-06-17 ENCOUNTER — Encounter (HOSPITAL_BASED_OUTPATIENT_CLINIC_OR_DEPARTMENT_OTHER): Payer: Self-pay | Admitting: Urology

## 2017-09-04 ENCOUNTER — Ambulatory Visit: Payer: Medicare HMO | Admitting: Adult Health

## 2017-09-04 ENCOUNTER — Encounter: Payer: Self-pay | Admitting: Adult Health

## 2017-09-04 VITALS — BP 134/80 | HR 86 | Ht 66.0 in | Wt 212.0 lb

## 2017-09-04 DIAGNOSIS — G20 Parkinson's disease: Secondary | ICD-10-CM | POA: Diagnosis not present

## 2017-09-04 DIAGNOSIS — G2581 Restless legs syndrome: Secondary | ICD-10-CM

## 2017-09-04 NOTE — Progress Notes (Signed)
PATIENT: Luke Bennett DOB: 1945-11-07  REASON FOR VISIT: follow up Luke FROM: patient  Luke OF PRESENT ILLNESS: Today 09/04/17:  Luke Bennett is a 72 year old male with a Luke of Parkinson's disease and restless leg syndrome.  He returns today for follow-up.  He states that he takes 2 tablets of Requip before bedtime.  He states he typically takes 1 tablet 2 hours later as well.  He also wears a Neupro patch.  He states that he typically is able to fall asleep 1 hour after the third dose.  He does have a tremor in the right hand.  Denies any significant changes with his gait or balance.  Reports he has had some difficulty with turns.  Reports good appetite.  Denies any trouble chewing or swallowing.  He returns today for evaluation.  Luke Bennett is a 72 y.o. male , seen here as in a referral from Dr. Desmond Dike for RLS follow up.  Mr. has had been tried on dopaminergic agonists in tablet form, for a brief while he was on carbidopa levodopa but this caused a rash, and he then switched to the new pro patch which provided the most relief.  He is currently taking ropinirole 0.5 mg at night up to 2 tablets by mouth, he is taking the Neupro patch at 6 mg. He reports that his restless legs have become worse again."They will not stop"he is not sure how many hours of sleep he actually gets, but the restless legs make it hard for him to fall asleep initially and stay asleep as well.  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  He is waering a  urostomy bag now, and reports the tubing hurts. He has not to wake up to urinate.  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  Sleep habits are as follows: The patient aims for a bedtime around 10 PM 30 minutes.  He waits to be tired enough to be more likely successfully entering  sleep.  Once asleep he can sleep for 2-3 hours unless woken by external factors.  Once awake it is very hard for him to go back to sleep.  He sleeps on his side because of the urostomy tube and colostomy bag.  While he does not wake up from the urge to urinate he does feel pressure.  He always wakes up on his back. Sleeps on one pillow on a flat mattress.  The bedroom is cool quiet and dark.  He rises usually at 4 oh 4:30 AM.  I estimate that the patient only gets about 5 hours of sleep at night if that.  Sleep medical Luke and family sleep Luke: see previous note. Colostomy, urostomy. RLS , PD/  Hoarse voice.   Social Luke:  Married, he worked for Administrator, arts- Curator / receiving. 4 adult children.     REVIEW OF SYSTEMS: Out of a complete 14 system review of symptoms, the patient complains only of the following symptoms, and all other reviewed systems are negative.  ALLERGIES: Allergies  Allergen Reactions  . Aspirin Other (See Comments)    HX Stomach bleeding   . Celecoxib Other (See Comments)    Stomach pain  . Bactrim [Sulfamethoxazole-Trimethoprim] Rash    Skin rash  . Sinemet [Carbidopa-Levodopa] Rash    HOME MEDICATIONS: Outpatient  Medications Prior to Visit  Medication Sig Dispense Refill  . adalimumab (HUMIRA PEN) 40 MG/0.8ML injection Inject 40 mg into the skin every 14 (fourteen) days. On Monday's    . Cholecalciferol (VITAMIN D3) 400 units CAPS Take by mouth daily.    . Multiple Vitamin (MULTIVITAMIN WITH MINERALS) TABS tablet Take 1 tablet by mouth daily.    . Prenatal Vit-Fe Fumarate-FA (MULTIVITAMIN-PRENATAL) 27-0.8 MG TABS tablet Take 2 tablets by mouth daily at 12 noon.    . QUEtiapine (SEROQUEL) 300 MG tablet Take 300 mg by mouth at bedtime.   4  . rOPINIRole (REQUIP) 0.5 MG tablet Take 2-3 tablets (1-1.5 mg total) by mouth at bedtime. Take up to 4 tablets at bedtime. 90 tablet 5  . rotigotine (NEUPRO) 6 MG/24HR Place 1 patch onto the skin daily.  30 patch 12  . traMADol (ULTRAM) 50 MG tablet Take 1 tablet (50 mg total) by mouth every 6 (six) hours as needed. 30 tablet 0  . trihexyphenidyl (ARTANE) 2 MG tablet Take 1 tablet (2 mg total) by mouth every evening. 30 tablet 11   No facility-administered medications prior to visit.     PAST MEDICAL Luke: Past Medical Luke:  Diagnosis Date  . Anxiety   . Arthritis   . BPH with urinary obstruction   . CKD (chronic kidney disease), stage III ([place])   . Coronary artery disease    cardiologist-  dr Atilano Median [facility] cardiology [facility]) per lov note dated 05/30/2016  pt hx MI approx. 2001 had cardiac cath in [place] native vessel disease no intervention   . Crohn's disease ([place])    followed by dr c. Cindee Salt (digestive specialist w/ [facility] in [place])-- dx approx. 2003 s/p  proctocolecotmy w/ ostomy and colectomy in summer 2012 for bowel obstruction;  treated w/ Humira injection's  . Depression   . Drug-induced cytopenia    hx previous treatment w/ 6MP  . Drug-induced pancytopenia ([facility])    hx previous treatment w/ 6MP  . Eczema   . Foley catheter in place   . Gait disturbance    secondary to parkinson's  . GERD (gastroesophageal reflux disease)   . Luke of DVT (deep vein thrombosis)    post op surgery 2012-- upper extremity--  treated w/ coumadin 6 months  . Luke of MI (myocardial infarction) 2001  . Luke of pulmonary embolus (PE)    2003 and 2012-- post surgery --  treated w/ coumadin for 6 months  . Luke of transient ischemic attack (TIA)    2012--- no residual  . Ileostomy in place [facility])   . Iron deficiency anemia   . NAFL (nonalcoholic fatty liver)    followed by dr c. Cindee Salt (digestive specialist w/ [facility])  lov note in epic dated 11-20-2016,  stage 0 fibriosis  . Nephrolithiasis    bilateral nonobstructive stones for Renal US dated 04-05-2017  . Parkinson's disease [facility])    neurologist-  dr Bethena Midget ([place] [facility])  . Pre-diabetes   . RLS  (restless legs syndrome)   . Schizoaffective disorder ([place])   . 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 Tremor    secondary to parkinson's  . Urinary retention   . Wears glasses     PAST SURGICAL Luke: Past Surgical Luke:  Procedure Laterality Date  . ABDOMINOPERINEAL PROCTOCOLECTOMY  2003   W/  CREATION OSTOMY  . CARDIAC CATHETERIZATION  2001 approx. in [place]  native vessel disease (per dr Atilano Median note dated 05-30-2016 , cardiologist), in epic)  . COLECTOMY  summer 2012  . CYSTOSCOPY WITH INSERTION OF UROLIFT N/A 06/16/2017   Procedure: CYSTOSCOPY WITH INSERTION OF UROLIFT;  Surgeon: Cleon Gustin, MD;  Location: [facility];  Service: Urology;  Laterality: N/A;  . TONSILLECTOMY  child  . TRANSTHORACIC ECHOCARDIOGRAM  ECHO 03-27-2017   ef 51-02%, grade 1 diastolic dysfunction    FAMILY Luke: Family Luke  Problem Relation Age of Onset  . Cerebral aneurysm Father   . Hypertension Sister   . Hodgkin's lymphoma Sister   . Parkinsonism Neg Hx     SOCIAL Luke: Social Luke   Socioeconomic Luke  . Marital status: Married    Spouse name: Not on file  . Number of children: 4  . Years of education: Not on file  . Highest education level: Not on file  Occupational Luke  . Occupation: Retired  Scientific laboratory technician  . Financial resource strain: Not on file  . Food insecurity:    Worry: Not on file    Inability: Not on file  . Transportation needs:    Medical: Not on file    Non-medical: Not on file  Tobacco Use  . Smoking status: Former Smoker    Years: 2.00    Types: Cigarettes    Last attempt to quit: 03/25/1969    Years since quitting: 48.4  . Smokeless tobacco: Never Used  Substance and Sexual Activity  . Alcohol use: No    Alcohol/week: 0.0 oz  . Drug use: No  . Sexual activity: Not on file  Lifestyle  . Physical activity:    Days per week: Not on file     Minutes per session: Not on file  . Stress: Not on file  Relationships  . Social connections:    Talks on phone: Not on file    Gets together: Not on file    Attends religious service: Not on file    Active member of club or organization: Not on file    Attends meetings of clubs or organizations: Not on file    Relationship status: Not on file  . Intimate partner violence:    Fear of current or ex partner: Not on file    Emotionally abused: Not on file    Physically abused: Not on file    Forced sexual activity: Not on file  Other Topics Concern  . Not on file  Social Luke Narrative   Lives at home with wife and in-laws, 6 people total.   Caffeine use: 1 cup Dr. Malachi Bonds "every 1.5 weeks"   Right handed         PHYSICAL EXAM  Vitals:   09/04/17 1320  BP: 134/80  Pulse: 86  Weight: 212 lb (96.2 kg)  Height: 5' 6"  (1.676 m)   Body mass index is 34.22 kg/m.  Generalized: Well developed, in no acute distress   Neurological examination  Mentation: Alert oriented to time, place, Luke taking. Follows all commands speech and language fluent Cranial nerve II-XII: Pupils were equal round reactive to light. Extraocular movements were full, visual field were full on confrontational test. Facial sensation and strength were normal. Uvula tongue midline. Head turning and shoulder shrug  were normal and symmetric. Motor: The motor testing reveals 5 over 5 strength of all 4 extremities. Good symmetric motor tone is noted throughout.  Resting tremor noted in the right hand sensory: Sensory testing is intact to soft  touch on all 4 extremities. No evidence of extinction is noted.  Coordination: Cerebellar testing reveals good finger-nose-finger and heel-to-shin bilaterally.  Gait and station: Patient has a slightly stooped posture.  Decreased stride as well as arm swing.  Tandem gait not attempted. Reflexes: Deep tendon reflexes are symmetric and normal bilaterally.   DIAGNOSTIC DATA  (LABS, IMAGING, TESTING) - I reviewed patient records, labs, notes, testing and imaging myself where available.  Lab Results  Component Value Date   WBC 3.6 (L) 04/06/2017   HGB 14.6 06/16/2017   HCT 43.0 06/16/2017   MCV 94.6 04/06/2017   PLT 87 (L) 04/06/2017      Component Value Date/Time   NA 142 06/16/2017 1042   NA 137 02/11/2017 1515   K 4.7 06/16/2017 1042   CL 113 (H) 04/06/2017 0621   CO2 19 (L) 04/06/2017 0621   GLUCOSE 109 (H) 06/16/2017 1042   BUN 18 04/06/2017 0621   BUN 14 02/11/2017 1515   CREATININE 1.08 04/06/2017 0621   CALCIUM 8.1 (L) 04/06/2017 0621   PROT 7.1 02/11/2017 1515   ALBUMIN 4.0 02/11/2017 1515   AST 68 (H) 02/11/2017 1515   ALT 54 (H) 02/11/2017 1515   ALKPHOS 54 02/11/2017 1515   BILITOT 0.9 02/11/2017 1515   GFRNONAA >60 04/06/2017 0621   GFRAA >60 04/06/2017 0621    Lab Results  Component Value Date   VITAMINB12 546 09/28/2014   Lab Results  Component Value Date   TSH 1.240 09/28/2014      ASSESSMENT AND PLAN 72 y.o. year old male  has a past medical Luke of Anxiety, Arthritis, BPH with urinary obstruction, CKD (chronic kidney disease), stage III ([place]), Coronary artery disease, Crohn's disease ([place]), Depression, Drug-induced cytopenia, Drug-induced pancytopenia ([place]), Eczema, Foley catheter in place, Gait disturbance, GERD (gastroesophageal reflux disease), Luke of DVT (deep vein thrombosis), Luke of MI (myocardial infarction) (2001), Luke of pulmonary embolus (PE), Luke of transient ischemic attack (TIA), Ileostomy in place ([place]), Iron deficiency anemia, NAFL (nonalcoholic fatty liver), Nephrolithiasis, Parkinson's disease ([place]), Pre-diabetes, RLS (restless legs syndrome), Schizoaffective disorder ([place]), Thrombocytopenia ([place]) (followed by pcp, dr briscoe), Tremor, Urinary retention, and Wears glasses. here with :  1.  Parkinson's disease 2.  Restless leg syndrome  The patient will continue on Requip.  I have  advised that he could potentially try taking all 3 tablets before bedtime to see if this helps him fall asleep sooner.  Will continue on the Neupro patch.  I advised that if his symptoms worsen or he develops new symptoms he should let us know.  He will follow-up in 6 months or sooner if needed.   Ward Givens, MSN, NP-C 09/04/2017, 1:14 PM [facility] [address], [name], [place] [address] [phone]

## 2017-09-04 NOTE — Patient Instructions (Signed)
Your Plan:  Continue Requip and Neupro patch If your symptoms worsen or you develop new symptoms please let us know.   Thank you for coming to see [place] at [facility]. I hope we have been able to provide you high quality care today.  You may receive a patient satisfaction survey over the next few weeks. We would appreciate your feedback and comments so that we may continue to improve ourselves and the health of our patients.

## 2017-09-04 NOTE — Progress Notes (Signed)
Personally  participated in, made any corrections needed, and agree with history, physical, neuro exam,assessment and plan as stated above.    Doni Widmer, MD [facility] 

## 2017-09-22 ENCOUNTER — Other Ambulatory Visit: Payer: Self-pay | Admitting: Neurology

## 2017-10-14 ENCOUNTER — Telehealth: Payer: Self-pay | Admitting: *Deleted

## 2017-10-14 NOTE — Telephone Encounter (Signed)
Received fax re: Artane needs PA. Submitted information on CMM; message from [facility]- authorization for this med already on file.

## 2017-10-14 NOTE — Telephone Encounter (Signed)
Per Alice Rieger with cover my meds, the patient was trying to fill medication too soon and that is why a PA request was being sent to our office. The patient is due for refill, at the earliest tomorrow, 10/15/17. PA can be disregarded at this time.

## 2018-01-06 ENCOUNTER — Other Ambulatory Visit: Payer: Self-pay | Admitting: Neurology

## 2018-01-06 DIAGNOSIS — G2571 Drug induced akathisia: Secondary | ICD-10-CM

## 2018-01-06 DIAGNOSIS — G2581 Restless legs syndrome: Secondary | ICD-10-CM

## 2018-01-06 DIAGNOSIS — G20 Parkinson's disease: Secondary | ICD-10-CM

## 2018-01-06 DIAGNOSIS — T50905A Adverse effect of unspecified drugs, medicaments and biological substances, initial encounter: Secondary | ICD-10-CM

## 2018-01-06 DIAGNOSIS — G253 Myoclonus: Secondary | ICD-10-CM

## 2018-02-11 ENCOUNTER — Telehealth (HOSPITAL_COMMUNITY): Payer: Self-pay | Admitting: Psychiatry

## 2018-04-09 ENCOUNTER — Ambulatory Visit: Payer: Medicare HMO | Admitting: Adult Health

## 2018-04-24 ENCOUNTER — Other Ambulatory Visit: Payer: Self-pay | Admitting: Neurology

## 2018-05-06 ENCOUNTER — Telehealth: Payer: Self-pay | Admitting: Neurology

## 2018-05-06 NOTE — Telephone Encounter (Signed)
Ptss wife Mikeal Hawthorne said trihexyphenidyl (ARTANE) 2 MG tablet needs PA. She is wanting to know if the patient still needs to take the medication,. Please call to advise

## 2018-05-07 ENCOUNTER — Other Ambulatory Visit: Payer: Self-pay | Admitting: Neurology

## 2018-05-07 NOTE — Telephone Encounter (Signed)
That's fine he can wait and see amy it looks like he just refilled so he has some.

## 2018-05-07 NOTE — Telephone Encounter (Signed)
I spoke with pt's wife and discussed that since pt just refilled 1/31, will wait and see what is decided at the 2/20 office visit with Amy and if it is continued and a PA is needed, we can complete that. Juliann Pulse aware that last time a PA was triggered it was because an early fill was attempted. She verbalized understanding and appreciation for the call.

## 2018-05-07 NOTE — Telephone Encounter (Signed)
Patient's next appt is with Amy on 05/14/2018.

## 2018-05-07 NOTE — Telephone Encounter (Signed)
I called [facility] and spoke with Claiborne Billings. She stated pt just filled on 04/24/2018 for $4 however there are no refills on file for her to run through to see if PA is needed. She will send an electronic refill request.

## 2018-05-07 NOTE — Telephone Encounter (Signed)
Check for continuation.

## 2018-05-14 ENCOUNTER — Ambulatory Visit: Payer: Medicare HMO | Admitting: Neurology

## 2018-05-14 ENCOUNTER — Ambulatory Visit: Payer: Medicare HMO | Admitting: Family Medicine

## 2018-05-20 ENCOUNTER — Ambulatory Visit: Payer: 59 | Admitting: Family Medicine

## 2018-05-20 ENCOUNTER — Encounter: Payer: Self-pay | Admitting: Family Medicine

## 2018-05-20 VITALS — BP 133/82 | HR 83 | Ht 66.0 in | Wt 218.4 lb

## 2018-05-20 DIAGNOSIS — R251 Tremor, unspecified: Secondary | ICD-10-CM | POA: Diagnosis not present

## 2018-05-20 DIAGNOSIS — G2581 Restless legs syndrome: Secondary | ICD-10-CM | POA: Diagnosis not present

## 2018-05-20 DIAGNOSIS — G20 Parkinson's disease: Secondary | ICD-10-CM | POA: Diagnosis not present

## 2018-05-20 MED ORDER — TRIHEXYPHENIDYL HCL 2 MG PO TABS: 2.0000 mg | ORAL_TABLET | Freq: Every evening | ORAL | 11 refills | Status: AC

## 2018-05-20 MED ORDER — ROTIGOTINE 8 MG/24HR TD PT24: 1.0000 | MEDICATED_PATCH | Freq: Every day | TRANSDERMAL | 11 refills | Status: DC

## 2018-05-20 NOTE — Patient Instructions (Addendum)
Increase Neupro patch to 92m daily  Continue Artane 213m(1 tablet) at night, will consider increasing dose in future if needed  Continue ropinirole 0.7546m3 tablets) at night   Referral to PT for balance and gail evaluation, will attempt in home therapy     Parkinson Disease Parkinson disease is a long-term (chronic) condition. It gets worse over time (is progressive). Parkinson disease limits your ability:  To control how your body moves.  To move your body normally. This condition affects each person differently. The condition can range from mild to very bad. This condition tends to progress slowly over many years. Follow these instructions at home:  Take over-the-counter and prescription medicines only as told by your doctor.  Put grab bars and railings in your home. These help to prevent falls.  Follow instructions from your doctor about what you can or cannot eat or drink.  Go back to your normal activities as told by your doctor. Ask your doctor what activities are safe for you.  Exercise as told by your doctor or physical therapist.  Keep all follow-up visits as told by your doctor. This is important. These include any visits with a speech or occupational therapist.  Think about joining a support group for people who have Parkinson disease. Contact a doctor if:  Medicines do not help your symptoms.  You feel off-balance.  You fall at home.  You need more help at home.  You have: ? Trouble swallowing. ? A very hard time pooping (constipation). ? A lot of side effects from your medicines.  You see or hear things that are not real (hallucinate).  You feel: ? Confused. ? Anxious. ? Sad (depressed). Get help right away if:  You were hurt in a fall.  You cannot swallow without choking.  You have chest pain.  You have trouble breathing.  You do not feel safe at home. This information is not intended to replace advice given to you by your health care  provider. Make sure you discuss any questions you have with your health care provider. Document Released: 06/03/2011 Document Revised: 08/17/2015 Document Reviewed: 12/30/2014 Elsevier Interactive Patient Education  2019 [employer]

## 2018-05-20 NOTE — Progress Notes (Signed)
PATIENT: Luke Bennett DOB: 1945/05/02  REASON FOR VISIT: follow up HISTORY FROM: patient  Chief Complaint  Patient presents with  . Follow-up    6 month follow up. Wife present. Rm 5. Patient mentioned that his shaking alot more in her right hand than his left hand. Patient's wife mentioned that he's moving around alot slower now.  Patient mentioned that he has been having some balancing issues.   . Medications Concerns    Patient would like to know does he still need to take Trihexyphenidyl      HISTORY OF PRESENT ILLNESS: Today 05/20/18 Luke Bennett is a 73 y.o. male here today for follow up for Parkinson's and RLS. He continues Nupro patch at 59m daily. He is taking ropinirole 0.261m2-3 tablets at night for RLS. He was restarted on Artane by psychiatry in [id number] is uncertain why.  He does feel that Artane helped with tremors in the beginning.  He does continue to have tremor that is worse in the right upper extremity.  His wife has noticed that he is much more slow in movement.  It takes him longer to walk than usual.  He is also having some trouble balancing.  He denies falls.  Otherwise he is feeling well today.  He continues to follow-up with PCP closely for chronic conditions.  He is also seeing psychiatry and doing well with Seroquel.   HISTORY: (copied from Luke Bennett on 09/04/2017) Luke Bennett a [age]ear old male with a history of Parkinson's disease and restless leg syndrome.  He returns today for follow-up.  He states that he takes 2 tablets of Requip before bedtime.  He states he typically takes 1 tablet 2 hours later as well.  He also wears a Neupro patch.  He states that he typically is able to fall asleep 1 hour after the third dose.  He does have a tremor in the right hand.  Denies any significant changes with his gait or balance.  Reports he has had some difficulty with turns.  Reports good appetite.  Denies any trouble chewing or swallowing.  He returns today  for evaluation.  HISTORY WiRayshardessis a [age].o.male, seen here as in a referral from Dr. Ahern/Briscoef[place] RLS follow up. Mr. has had been tried on dopaminergic agonists in tablet form, for a brief while he was on carbidopa levodopa but this caused a rash, and he then switched to the new pro patch which provided the most relief. He is currently taking ropinirole 0.5 mg at night up to 2 tablets by mouth, he is taking the Neupro patch at 6 mg. He reports that his restless legs have become worse again."They will not stop"he is not sure how many hours of sleep he actually gets, but the restless legs make it hard for him to fall asleep initially and stay asleep as well. 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 He is waeringa urostomy bag now, and reports the tubing hurts. He has not to wake up to urinate. 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  Sleep habits are as follows:The patient aims for a bedtime around 10 PM 30 minutes. He waits to be tired enough to be more likely successfully entering sleep. Once asleep he can sleep for 2-3 hours unless woken by external factors. Once awake it  is very hard for him to go back to sleep. He sleeps on his side because of the urostomy tube and colostomy bag.While he does not wake up from the urge to urinate he does feel pressure. He always wakes up on his back. Sleeps on one pillow on a flat mattress. The bedroom is cool quiet and dark. He rises usually at 4 oh 4:30 AM. I estimate that the patient only gets about 5 hours of sleep at night if that.  Sleep medical history and family sleep history:see previous note. Colostomy, urostomy. RLS , PD/ Hoarse voice.   Social history:Married, he worked for [employer], arts- Curator / receiving. 4 adult  children.   REVIEW OF SYSTEMS: Out of a complete 14 system review of symptoms, the patient complains only of the following symptoms, runny nose, eye itching, leg swelling, restless legs, snoring, itching, frequency of urination, tremors, depression, nervous/anxious and all other reviewed systems are negative.  ALLERGIES: Allergies  Allergen Reactions  . Carbidopa W-Levodopa Rash  . Aspirin Other (See Comments)    HX Stomach bleeding   . Celecoxib Other (See Comments)    Stomach pain  . Bactrim [Sulfamethoxazole-Trimethoprim] Rash    Skin rash  . Sinemet [Carbidopa-Levodopa] Rash    HOME MEDICATIONS: Outpatient Medications Prior to Visit  Medication Sig Dispense Refill  . adalimumab (HUMIRA PEN) 40 MG/0.8ML injection Inject 40 mg into the skin every 14 (fourteen) days. On Monday's    . Cholecalciferol (VITAMIN D3) 400 units CAPS Take by mouth daily.    . Multiple Vitamin (MULTIVITAMIN WITH MINERALS) TABS tablet Take 1 tablet by mouth daily.    [name] oxybutynin (DITROPAN) 5 MG tablet Take 5 mg by mouth.    . Prenatal Vit-Fe Fumarate-FA (MULTIVITAMIN-PRENATAL) 27-0.8 MG TABS tablet Take 2 tablets by mouth daily at 12 noon.    . QUEtiapine (SEROQUEL) 300 MG tablet Take 300 mg by mouth at bedtime.   4  . rOPINIRole (REQUIP) 0.5 MG tablet TAKE 2 TO 3 TABLETS BY MOUTH AT BEDTIME. MAXIMUM OF 4 TABLETS DAILY. 90 tablet 5  . traMADol (ULTRAM) 50 MG tablet Take 1 tablet (50 mg total) by mouth every 6 (six) hours as needed. 30 tablet 0  . rotigotine (NEUPRO) 6 MG/24HR Place 1 patch onto the skin daily. 30 patch 12  . trihexyphenidyl (ARTANE) 2 MG tablet TAKE 1 TABLET BY MOUTH IN THE EVENING 30 tablet 0   No facility-administered medications prior to visit.     PAST MEDICAL HISTORY: Past Medical History:  Diagnosis Date  . Anxiety   . Arthritis   . BPH with urinary obstruction   . CKD (chronic kidney disease), stage III ([place])   . Coronary artery disease    cardiologist-  dr Atilano Median  [facility] cardiology [facility]) per lov note dated 05/30/2016  pt hx MI approx. 2001 had cardiac cath in [place] native vessel disease no intervention   . Crohn's disease ([place])    followed by dr c. Cindee Salt (digestive specialist w/ [facility] in [place])-- dx approx. 2003 s/p  proctocolecotmy w/ ostomy and colectomy in summer 2012 for bowel obstruction;  treated w/ Humira injection's  . Depression   . Drug-induced cytopenia    hx previous treatment w/ 6MP  . Drug-induced pancytopenia (HCC)    hx previous treatment w/ 6MP  . Eczema   . Foley catheter in place   . Gait disturbance    secondary to parkinson's  . GERD (gastroesophageal reflux disease)   . History  of DVT (deep vein thrombosis)    post op surgery 2012-- upper extremity--  treated w/ coumadin 6 months  . History of MI (myocardial infarction) 2001  . History of pulmonary embolus (PE)    2003 and 2012-- post surgery --  treated w/ coumadin for 6 months  . History of transient ischemic attack (TIA)    2012--- no residual  . Ileostomy in place [facility])   . Iron deficiency anemia   . NAFL (nonalcoholic fatty liver)    followed by dr c. Cindee Salt (digestive specialist w/ [facility])  lov note in epic dated 11-20-2016,  stage 0 fibriosis  . Nephrolithiasis    bilateral nonobstructive stones for Renal US dated 04-05-2017  . Parkinson's disease [facility])    neurologist-  dr Bethena Midget ([facility])  . Pre-diabetes   . RLS (restless legs syndrome)   . Schizoaffective disorder ([place])   . 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 Tremor    secondary to parkinson's  . Urinary retention   . Wears glasses     PAST SURGICAL HISTORY: Past Surgical History:  Procedure Laterality Date  . ABDOMINOPERINEAL PROCTOCOLECTOMY  2003   W/  CREATION OSTOMY  . CARDIAC CATHETERIZATION  2001 approx. in [place]   native vessel disease (per dr Atilano Median note dated 05-30-2016 ,  cardiologist), in epic)  . COLECTOMY  summer 2012  . CYSTOSCOPY WITH INSERTION OF UROLIFT N/A 06/16/2017   Procedure: CYSTOSCOPY WITH INSERTION OF UROLIFT;  Surgeon: Cleon Gustin, MD;  Location: [facility];  Service: Urology;  Laterality: N/A;  . TONSILLECTOMY  child  . TRANSTHORACIC ECHOCARDIOGRAM  ECHO 03-27-2017   ef 23-95%, grade 1 diastolic dysfunction    FAMILY HISTORY: Family History  Problem Relation Age of Onset  . Cerebral aneurysm Father   . Hypertension Sister   . Hodgkin's lymphoma Sister   . Parkinsonism Neg Hx     SOCIAL HISTORY: Social History   Socioeconomic History  . Marital status: Married    Spouse name: Not on file  . Number of children: 4  . Years of education: Not on file  . Highest education level: Not on file  Occupational History  . Occupation: Retired  Scientific laboratory technician  . Financial resource strain: Not on file  . Food insecurity:    Worry: Not on file    Inability: Not on file  . Transportation needs:    Medical: Not on file    Non-medical: Not on file  Tobacco Use  . Smoking status: Former Smoker    Years: 2.00    Types: Cigarettes    Last attempt to quit: 03/25/1969    Years since quitting: 49.1  . Smokeless tobacco: Never Used  Substance and Sexual Activity  . Alcohol use: No    Alcohol/week: 0.0 standard drinks  . Drug use: No  . Sexual activity: Not on file  Lifestyle  . Physical activity:    Days per week: Not on file    Minutes per session: Not on file  . Stress: Not on file  Relationships  . Social connections:    Talks on phone: Not on file    Gets together: Not on file    Attends religious service: Not on file    Active member of club or organization: Not on file    Attends meetings of clubs or organizations: Not on file    Relationship status: Not on  file  . Intimate partner violence:    Fear of current or ex partner: Not on file    Emotionally abused: Not on file    Physically abused: Not on file     Forced sexual activity: Not on file  Other Topics Concern  . Not on file  Social History Narrative   Lives at home with wife and in-laws, 6 people total.   Caffeine use: 1 cup Dr. Malachi Bonds "every 1.5 weeks"   Right handed       PHYSICAL EXAM  Vitals:   05/20/18 1442  BP: 133/82  Pulse: 83  Weight: 218 lb 6.4 oz (99.1 kg)  Height: 5' 6"  (1.676 m)   Body mass index is 35.25 kg/m.  Generalized: Well developed, in no acute distress  Cardiology: normal rate and rhythm, no murmur noted Neurological examination  Mentation: Alert oriented to time, place, history taking. Follows all commands speech and language fluent Cranial nerve II-XII: Pupils were equal round reactive to light. Extraocular movements were full, visual field were full on confrontational test. Facial sensation and strength were normal. Uvula tongue midline. Head turning and shoulder shrug  were normal and symmetric. Motor: The motor testing reveals 5 over 5 strength of all 4 extremities. Good symmetric motor tone is noted throughout.  No bradykinesia noted with finger taps or toe taps. Sensory: Sensory testing is intact to soft touch on all 4 extremities. No evidence of extinction is noted.  Coordination: Cerebellar testing reveals good finger-nose-finger bilaterally.  Gait and station: Gait is narrow and guarded.  Did not assess tandem gait due to concerns of falls. Reflexes: Deep tendon reflexes are symmetric and normal bilaterally.   DIAGNOSTIC DATA (LABS, IMAGING, TESTING) - I reviewed patient records, labs, notes, testing and imaging myself where available.  No flowsheet data found.   Lab Results  Component Value Date   WBC 3.6 (L) 04/06/2017   HGB 14.6 06/16/2017   HCT 43.0 06/16/2017   MCV 94.6 04/06/2017   PLT 87 (L) 04/06/2017      Component Value Date/Time   NA 142 06/16/2017 1042   NA 137 02/11/2017 1515   K 4.7 06/16/2017 1042   CL 113 (H) 04/06/2017 0621   CO2 19 (L) 04/06/2017 0621    GLUCOSE 109 (H) 06/16/2017 1042   BUN 18 04/06/2017 0621   BUN 14 02/11/2017 1515   CREATININE 1.08 04/06/2017 0621   CALCIUM 8.1 (L) 04/06/2017 0621   PROT 7.1 02/11/2017 1515   ALBUMIN 4.0 02/11/2017 1515   AST 68 (H) 02/11/2017 1515   ALT 54 (H) 02/11/2017 1515   ALKPHOS 54 02/11/2017 1515   BILITOT 0.9 02/11/2017 1515   GFRNONAA >60 04/06/2017 0621   GFRAA >60 04/06/2017 0621   No results found for: CHOL, HDL, LDLCALC, LDLDIRECT, TRIG, CHOLHDL No results found for: HGBA1C Lab Results  Component Value Date   VITAMINB12 546 09/28/2014   Lab Results  Component Value Date   TSH 1.240 09/28/2014    ASSESSMENT AND PLAN 73 y.o. year old male  has a past medical history of Anxiety, Arthritis, BPH with urinary obstruction, CKD (chronic kidney disease), stage III ([place]), Coronary artery disease, Crohn's disease ([place]), Depression, Drug-induced cytopenia, Drug-induced pancytopenia ([place]), Eczema, Foley catheter in place, Gait disturbance, GERD (gastroesophageal reflux disease), History of DVT (deep vein thrombosis), History of MI (myocardial infarction) (2001), History of pulmonary embolus (PE), History of transient ischemic attack (TIA), Ileostomy in place [facility]), Iron deficiency anemia, NAFL (nonalcoholic fatty liver), Nephrolithiasis, Parkinson's  disease ([place]), Pre-diabetes, RLS (restless legs syndrome), Schizoaffective disorder ([place]), Thrombocytopenia ([place]) (followed by pcp, dr briscoe), Tremor, Urinary retention, and Wears glasses. here with     ICD-10-CM   1. Parkinson's disease ([place]) [place] Ambulatory referral to Physical Therapy    trihexyphenidyl (ARTANE) 2 MG tablet    Rotigotine (NEUPRO) 8 MG/24HR PT24    DISCONTINUED: Rotigotine (NEUPRO) 8 MG/24HR PT24  2. Tremor R25.1 trihexyphenidyl (ARTANE) 2 MG tablet  3. RLS (restless legs syndrome) G25.81     Overall Mr. Baldyga is doing well today.  Unfortunately he does continue to have concerns of tremor that is worse of the right hand.  He is  also very concerned about changes in his gait.  He is much slower than usual and feels off balance at times.  We will increase his Neupro patch to 8 mg daily.  He was advised on appropriate administration of this drug.  We will continue Artane 2 mg every evening for now.  May consider increasing dose in the future as needed.  I am also placing a referral for physical therapy.  Patient requested in-home therapy if possible.  Patient was advised to fall precautions.  Dr. Jaynee Eagles was in today and wishes to see the patient back in 3 months.  We will schedule follow-up with Dr. Jaynee Eagles for review.   Orders Placed This Encounter  Procedures  . Ambulatory referral to Physical Therapy    Referral Priority:   Routine    Referral Type:   Physical Medicine    Referral Reason:   Specialty Services Required    Requested Specialty:   Physical Therapy    Number of Visits Requested:   1     Meds ordered this encounter  Medications  . DISCONTD: Rotigotine (NEUPRO) 8 MG/24HR PT24    Sig: Place 1 patch onto the skin daily.    Dispense:  30 patch    Refill:  11    Order Specific Question:   Supervising Provider    Answer:   Melvenia Beam [id number]  . trihexyphenidyl (ARTANE) 2 MG tablet    Sig: Take 1 tablet (2 mg total) by mouth every evening.    Dispense:  30 tablet    Refill:  11    Order Specific Question:   Supervising Provider    Answer:   Melvenia Beam [id number]  . Rotigotine (NEUPRO) 8 MG/24HR PT24    Sig: Place 1 patch onto the skin daily.    Dispense:  30 patch    Refill:  11    Order Specific Question:   Supervising Provider    Answer:   Melvenia Beam [[id number]]      Marisella Puccio Ubaldo Glassing, FNP-C 05/20/2018, 4:11 PM [facility] [address] [place], [place] [address] [phone]

## 2018-05-20 NOTE — Progress Notes (Signed)
Made any corrections needed, and agree with history, physical, neuro exam,assessment and plan as stated.  Sarina Ill, MD

## 2018-05-21 ENCOUNTER — Telehealth: Payer: Self-pay | Admitting: Family Medicine

## 2018-05-21 NOTE — Telephone Encounter (Signed)
Pt's wife Mikeal Hawthorne needs Rotigotine (NEUPRO) 8 MG/24HR PT24 sent to [facility] (p) [phone]. She said the script at [facility] has to be cancelled in order for the other to be processed. Please call to advise

## 2018-05-21 NOTE — Telephone Encounter (Signed)
Spoke with Luke Bennett at [facility] and the Rotigotine (NEUPRO) 8 MG/24HR PT24 has been cancelled.  Spoke with Caitlyn with [facility] pharmacy and his Rotigotine (NEUPRO) 8 MG/24HR PT24 will be processed. She stated the pharmacy would be notified once the medication is ready.

## 2018-06-03 ENCOUNTER — Telehealth: Payer: Self-pay | Admitting: Family Medicine

## 2018-06-03 NOTE — Telephone Encounter (Signed)
Pt's wife on DPR called wanting an update on the auth for the trihexyphenidyl (ARTANE) 2 MG tablet Please advise.

## 2018-06-04 ENCOUNTER — Telehealth: Payer: Self-pay | Admitting: Family Medicine

## 2018-06-04 NOTE — Telephone Encounter (Signed)
PT [facility] has called to inform that the Home Care admission was done on 03-05 he is asking for verbal orders for 8 weeks

## 2018-06-04 NOTE — Telephone Encounter (Signed)
Spoke with the patients wife to let her know that since Amy decided to continue his trihexyphenidyl (ARTANE) 2 MG tablet that they should be able to refill the medication with no problem at the pharmacy. I explained that a PA was triggered due to him refilling the medication early. I also mentioned that if a PA is needed that pharmacy can fax the paperwork to our office. She was very appreciative for the call. No other questions or concerns at this time.

## 2018-06-04 NOTE — Telephone Encounter (Signed)
Left a voicemail giving a verbal Home care admission. Office number was provided in case he has any further questions.

## 2018-06-10 ENCOUNTER — Telehealth: Payer: Self-pay | Admitting: *Deleted

## 2018-06-10 NOTE — Telephone Encounter (Signed)
PT plan of care forms signed by Dr. Jaynee Eagles & faxed back to [facility]. Received a receipt of confirmation.

## 2018-06-11 NOTE — Telephone Encounter (Signed)
Received plan of care forms. Signed by Dr. Jaynee Eagles & returned to [facility] via fax. Received a receipt of confirmation.

## 2018-06-18 ENCOUNTER — Telehealth: Payer: Self-pay | Admitting: *Deleted

## 2018-06-18 DIAGNOSIS — G2581 Restless legs syndrome: Secondary | ICD-10-CM

## 2018-06-18 DIAGNOSIS — T50905A Adverse effect of unspecified drugs, medicaments and biological substances, initial encounter: Secondary | ICD-10-CM

## 2018-06-18 DIAGNOSIS — G20 Parkinson's disease: Secondary | ICD-10-CM

## 2018-06-18 DIAGNOSIS — G2571 Drug induced akathisia: Secondary | ICD-10-CM

## 2018-06-18 DIAGNOSIS — G253 Myoclonus: Secondary | ICD-10-CM

## 2018-06-18 MED ORDER — ROPINIROLE HCL 0.5 MG PO TABS: ORAL_TABLET | ORAL | 1 refills | Status: DC

## 2018-06-18 NOTE — Telephone Encounter (Signed)
Received refill request for ropinirole from [facility] mail service. E scribed refill x 90 day with 1 additional refill.

## 2018-06-22 ENCOUNTER — Telehealth: Payer: Self-pay

## 2018-06-22 NOTE — Telephone Encounter (Signed)
Pending approval for Ropinirole tablets Key: [id number] Rx #: [id number] PA Case ID: [id number]   PA Case: [id number], Status: Approved, Coverage Starts on: 03/25/2018 12:00:00 AM, Coverage Ends on: 03/25/2019 12:00:00 AM. Questions? Contact [phone].

## 2018-06-24 NOTE — Telephone Encounter (Signed)
Received a fax from [facility] requesting at [facility] for trihexyphenidyl (ARTANE) 2 MG tablet. PA information is listed below.    [place] Simmonds PA Case Id: [id number] ICD 10 Codes: G20 and R25.1  PA has been approved from 03/25/2018 through 03/25/2019.

## 2018-06-29 ENCOUNTER — Telehealth: Payer: Self-pay | Admitting: *Deleted

## 2018-06-29 ENCOUNTER — Telehealth: Payer: Self-pay

## 2018-06-29 NOTE — Telephone Encounter (Signed)
Fax received for ropinirole 0.7m tablets from [facility]  Fill date 06-17-18.   Take 2-3 at bedtime (max 4 tabs) #270 with one refill.  Last escribed 06-18-18 with receipt confirmed.

## 2018-06-29 NOTE — Telephone Encounter (Signed)
Received a fax from [facility] stating that Artane 2 mg need a PA to be done. PA has been approved through 03/25/2019. A copy of the approval for Artane 2 mg has been faxed back to [facility]. Confirmation fax has been received.

## 2018-08-14 ENCOUNTER — Other Ambulatory Visit: Payer: Self-pay | Admitting: Neurology

## 2018-08-14 DIAGNOSIS — G20 Parkinson's disease: Secondary | ICD-10-CM

## 2018-08-14 DIAGNOSIS — G2571 Drug induced akathisia: Secondary | ICD-10-CM

## 2018-08-14 DIAGNOSIS — G253 Myoclonus: Secondary | ICD-10-CM

## 2018-08-14 DIAGNOSIS — G2581 Restless legs syndrome: Secondary | ICD-10-CM

## 2018-08-24 ENCOUNTER — Ambulatory Visit: Payer: 59 | Admitting: Neurology

## 2018-08-26 ENCOUNTER — Telehealth: Payer: Self-pay | Admitting: *Deleted

## 2018-08-26 ENCOUNTER — Encounter: Payer: Self-pay | Admitting: *Deleted

## 2018-08-26 NOTE — Telephone Encounter (Signed)
Spoke with pt's wife Juliann Pulse (on [place]) and discussed appt tomorrow. They are still in quarantine. I discussed that d/t the covid19 pandemic our office is doing telemedicine right now. She agreed to a virtual visit. She understands that although there may be some limitations with this type of visit, we will take all precautions to reduce any security or privacy concerns.  She understands that this will be treated like an in office visit and we will file with pt's insurance. Cell phone carrier is [employer] [phone]. She understands the link will be texted to her and understands to join meeting 5-10 minutes prior to appt at 3:30. She will receive a call at 3:00 pm to check-in. She also confirmed pt's name&dob and provided updates to chart. No changes to history or allergies. Meds updated. She will call back if she does not receive the link. She verbalized appreciation for the call.   Doxy link for Dr. Jaynee Eagles sent to [phone].

## 2018-08-27 ENCOUNTER — Other Ambulatory Visit: Payer: Self-pay

## 2018-08-27 ENCOUNTER — Ambulatory Visit (INDEPENDENT_AMBULATORY_CARE_PROVIDER_SITE_OTHER): Payer: Medicare HMO | Admitting: Neurology

## 2018-08-27 DIAGNOSIS — G20 Parkinson's disease: Secondary | ICD-10-CM

## 2018-08-27 NOTE — Progress Notes (Signed)
[facility]    Provider:  Dr Jaynee Eagles Referring Provider: Katherina Mires, MD Primary Care Physician:  Katherina Mires, MD   CC: Parkinson's disease  Virtual Visit via Telephone Note  I connected with Luke Bennett on 09/08/18 at  3:30 PM EDT by telephone and verified that I am speaking with the correct person using two identifiers.  Location: Patient: home Provider: office   I discussed the limitations, risks, security and privacy concerns of performing an evaluation and management service by telephone and the availability of in person appointments. I also discussed with the patient that there may be a patient responsible charge related to this service. The patient expressed understanding and agreed to proceed.  Follow Up Instructions:    I discussed the assessment and treatment plan with the patient. The patient was provided an opportunity to ask questions and all were answered. The patient agreed with the plan and demonstrated an understanding of the instructions.   The patient was advised to call back or seek an in-person evaluation if the symptoms worsen or if the condition fails to improve as anticipated.  I provided 25 minutes of non-face-to-face time during this encounter.   Melvenia Beam, MD     History Summary (for details on each appointment see individual visits below): Luke Bennett is a 73 y.o. male here as a referral from Dr. Doreene Nest for tremor. He has a PMHx of Schizoaffective disorder on Seroquel, depression, anxiety, TIA, MI, Crohn's Dz, thrombocytopenia, CKD, glucose intolerance.  He was originally seen in 2016 and at that time he reported that he was acting out his dreams, he had a resting tremor which is progressive.  He reported some memory loss, walking slower, tiring easily, tremor mostly with walking or resting less so with use.  No tremor when sleeping.  He denied any decline in smell or constipation or problems swallowing.  But he did  endorse handwriting is getting poor, denied hypophonia or shuffling.  Patient had a history of dopamine blocking agents which can cause parkinsonism.  An MRI of the brain was ordered which was unremarkable.  A trial of Sinemet was given.  Also a DaTscan was ordered.  DaTscan was consistent with Parkinson's disease, he had been on Seroquel 300 mg with a long psychiatric history for which multiple medications have been tried and it was impossible for him to stop or decrease the Seroquel.  He did not do well on the Sinemet and we changed to dopamine agonist.  He did very well on the Neupro patch which helped his resting tremor and that was slowly increased.  We tried both amantadine and Artane.  Patient already had a sleep study in the past as well.  He was also on ropinirole at night.  Medications tried: Sinemet (rash, had to stop), Mirapex, Neupro, Artane, amantadine, Requip.  Interval history: He is on 71m Neupro, tried to increase but had confusion. They have been quaranteening for a while. He feels his tremors are in his right hand. RWells GuilesTat. 4 month follow up.   Medications tried: Sinemet (rash, had to stop), Mirapex, Neupro, Artane, Amantadine, requip  Interval history: He was getting confused so we decreased Neupro to 622mand he is doing great. His RLS is worsening, Ropinerole is not helping anymore.   He is on 54m52meupro Artane - is it helping? May consider stopping it ropinerole at night - increase however watch for confusion, augmentation, check labs today   Interval history: He is more  confused. Has a hard time understanding things. Forgetting things he does every day. Walking slower. No hallucinations. No swallowing difficulties, no falls. Already had a sleep study.   Interval history: His tremor is doing well. He is slowing down. No new symptoms. He is on Artane. Will try 74m neupro patch. Discussed options, decided to increase patch, if he does well call for new prescription.    Interval history 08/01/2015: Patient is doing very well and Neupro patch and his tremor is much improved. He still has a very slight right hand resting tremor. He is doing much better on the neupro patch. Patient is slowing down. He feels quite fatigued. Feels as though he is walking slower. Discussed this today. We'll hold off on increasing the Neupro patch. Try amantadine as this is use for fatigue and that is also used a tremor and Parkinson's. Discussed side effects.  Interval history 05/01/2015: Patient and wife come into the office today to review DAT scan. DAT scan consistent with Parkinson's disease. Patient has been on Seroquel 300 mg and has a long psychiatric history for which multiple medications have been tried. It is not possible for him to stop or decrease the Seroquel.   The potentially interfering drugs consist of: amoxapine, amphetamine, benztropine, bupropion, buspirone, citalopram, cocaine, mazindol, methamphetamine, methylphenidate, norephedrine, phentermine, escitalopram, phenylpropanolamine, selegiline, paroxetine, and sertraline of which patient is not taking. Patient could not stop the Seroquel and this drug likely will not interfere wtth the results. .Luke Bennett HPI: [name] a 73y.o. male here as a referral from Dr. BDoreene Nestfor tremor. He has a PMHx of Schizoaffective disorder on Seroquel, depression, anxiety, TIA, MI, Crohn's Dz, thrombocytopenia, CKD, glucose intolerance. He is here with his wife who provides information. He acts out his dreams, she almost got clobbered the other night, hit her pillow. His resting tremor continues, feels it is worsening. Discussed MRI of the brain which was normal. Discussed his previous history of dopamine-blocking agents and his parkinsonism. Can try Sinemet and see how he responds, warned that it can cause confusion, hallucinations which is worrisome given patient's past medical history. He is on Seroquel currently. I have asked him to  follow with psychiatry as well.   Previous history:He has a worsening tremor, started a few months ago. Also noticing that his right shoulder is lower than the other and holds his hand on his stomach when he walks possibly to try and keep it from trembling. He is having a harder time remembering things. Memory is less than they used to be. Walking is slower but steady. He gets tired easily. No falls. He has had several episdes at night with feelings like he was going to collapse. He is changing a lot, declining. He will forget things in 15 minutes, wife will tell him something and he forgets, has to be reminded. He forgets things he does every day, like setting the table - forgets things that go on the table. He is getitng easily confused by tasks. Wife pays the bills. He does not drive. He is on Humira for Crohn's disease. They notice the tremor all the time. Mostly with resting or walking, sitting or eating, less with use. Doesn't happen when sleeping. Smell is fine. No constipation. No problems swallowing. Handwriting is getting poorer, shaky and smaller. No other focal neurologic symptoms. Denies hypophonia, shuffling. No other focal neurologic deficits.   Reviewed notes, labs and imaging from outside physicians, which showed: Notes from Dr. KSuzanna Obeyalso document that  patient's tremor has been worsening, right hand only, at rest. Does not affect ADLs. No gait instability. She was also concern for idiopathic Parkinson's disease versus TIA or stroke. He is on Humira for Crohn's disease. She also noted right greater than left cogwheeling on exam.  Review of Systems: Patient complains of symptoms per HPI as well as the following symptoms: tremor. No CP, no SOB. Pertinent negatives per HPI. All others negative.  Social History   Socioeconomic History   Marital status: Married    Spouse name: Not on file   Number of children: 4   Years of education: Not on file   Highest education level:  Not on file  Occupational History   Occupation: Retired  Scientist, product/process development strain: Not on file   Food insecurity:    Worry: Not on file    Inability: Not on file   Transportation needs:    Medical: Not on file    Non-medical: Not on file  Tobacco Use   Smoking status: Former Smoker    Years: 2.00    Types: Cigarettes    Last attempt to quit: 03/25/1969    Years since quitting: 49.4   Smokeless tobacco: Never Used  Substance and Sexual Activity   Alcohol use: No    Alcohol/week: 0.0 standard drinks   Drug use: No   Sexual activity: Not on file  Lifestyle   Physical activity:    Days per week: Not on file    Minutes per session: Not on file   Stress: Not on file  Relationships   Social connections:    Talks on phone: Not on file    Gets together: Not on file    Attends religious service: Not on file    Active member of club or organization: Not on file    Attends meetings of clubs or organizations: Not on file    Relationship status: Not on file   Intimate partner violence:    Fear of current or ex partner: Not on file    Emotionally abused: Not on file    Physically abused: Not on file    Forced sexual activity: Not on file  Other Topics Concern   Not on file  Social History Narrative   Lives at home with wife and mother in-law, 3 people total.   Caffeine use: 1 cup Dr. Malachi Bonds "every 1.5 weeks" (as of 08/26/2018 hasn't had in awhile).    Right handed       Family History  Problem Relation Age of Onset   Cerebral aneurysm Father    Hypertension Sister    Hodgkin's lymphoma Sister    Parkinsonism Neg Hx     Past Medical History:  Diagnosis Date   Anxiety    Arthritis    BPH with urinary obstruction    CKD (chronic kidney disease), stage III [facility])    Coronary artery disease    cardiologist-  dr Atilano Median Narda Amber cardiology [facility]) per lov note dated 05/30/2016  pt hx MI approx. 2001 had cardiac cath in [place] native  vessel disease no intervention    Crohn's disease [facility])    followed by dr c. Cindee Salt (digestive specialist w/ [facility] in [place])-- dx approx. 2003 s/p  proctocolecotmy w/ ostomy and colectomy in summer 2012 for bowel obstruction;  treated w/ Humira injection's   Depression    Drug-induced cytopenia    hx previous treatment w/ 6MP   Drug-induced pancytopenia (HCC)    hx  previous treatment w/ 6MP   Eczema    Foley catheter in place    Gait disturbance    secondary to parkinson's   GERD (gastroesophageal reflux disease)    History of DVT (deep vein thrombosis)    post op surgery 2012-- upper extremity--  treated w/ coumadin 6 months   History of MI (myocardial infarction) 2001   History of pulmonary embolus (PE)    2003 and 2012-- post surgery --  treated w/ coumadin for 6 months   History of transient ischemic attack (TIA)    2012--- no residual   Ileostomy in place [facility])    Iron deficiency anemia    NAFL (nonalcoholic fatty liver)    followed by dr c. Cindee Salt (digestive specialist w/ [facility])  lov note in epic dated 11-20-2016,  stage 0 fibriosis   Nephrolithiasis    bilateral nonobstructive stones for Renal US dated 04-05-2017   Parkinson's disease [facility])    neurologist-  dr Bethena Midget ([facility])   Pre-diabetes    RLS (restless legs syndrome)    Schizoaffective disorder ([place])    Thrombocytopenia ([place]) followed by pcp, dr briscoe   chronic -- secondary to 6-MP treatement's and Humira   Tremor    secondary to parkinson's   Urinary retention    Wears glasses     Past Surgical History:  Procedure Laterality Date   ABDOMINOPERINEAL PROCTOCOLECTOMY  2003   W/  CREATION OSTOMY   CARDIAC CATHETERIZATION  2001 approx. in [place]   native vessel disease (per dr Atilano Median note dated 05-30-2016 , cardiologist), in epic)   [place]  summer 2012   [place] N/A 06/16/2017   Procedure: CYSTOSCOPY WITH INSERTION OF  UROLIFT;  Surgeon: Cleon Gustin, MD;  Location[facility];  Service: Urology;  Laterality: N/A;   TONSILLECTOMY  child   TRANSTHORACIC ECHOCARDIOGRAM  ECHO 03-27-2017   ef 01-75%, grade 1 diastolic dysfunction    Current Outpatient Medications  Medication Sig Dispense Refill   adalimumab (HUMIRA PEN) 40 MG/0.8ML injection Inject 40 mg into the skin every 14 (fourteen) days. On Monday's     aspirin EC 81 MG tablet Take 81 mg by mouth daily.     Cholecalciferol (VITAMIN D3) 400 units CAPS Take by mouth daily.     Multiple Vitamin (MULTIVITAMIN WITH MINERALS) TABS tablet Take 1 tablet by mouth daily.     oxybutynin (DITROPAN) 5 MG tablet Take 5 mg by mouth.     Prenatal Vit-Fe Fumarate-FA (MULTIVITAMIN-PRENATAL) 27-0.8 MG TABS tablet Take 2 tablets by mouth daily at 12 noon.     QUEtiapine (SEROQUEL) 300 MG tablet Take 300 mg by mouth at bedtime.   4   rOPINIRole (REQUIP) 0.5 MG tablet TAKE 2 - 3 TAbS BY MOUTH AT BEDTIME. MAXIMUM OF 4 TABS DAILY. 270 tablet 1   Rotigotine (NEUPRO) 8 MG/24HR PT24 Place 1 patch onto the skin daily. 30 patch 11   trihexyphenidyl (ARTANE) 2 MG tablet Take 1 tablet (2 mg total) by mouth every evening. (Patient not taking: Reported on 08/26/2018) 30 tablet 11   No current facility-administered medications for this visit.     Allergies as of 08/27/2018 - Review Complete 08/26/2018  Allergen Reaction Noted   Carbidopa w-levodopa Rash 04/19/2015   Aspirin Other (See Comments) 12/04/2012   Celecoxib Other (See Comments) 12/04/2012   Bactrim [sulfamethoxazole-trimethoprim] Rash 04/05/2017   Sinemet [carbidopa-levodopa] Rash 03/01/2015    Vitals: There were no vitals taken for this  visit. Last Weight:  Wt Readings from Last 1 Encounters:  05/20/18 218 lb 6.4 oz (99.1 kg)   Last Height:   Ht Readings from Last 1 Encounters:  05/20/18 5' 6"  (1.676 m)    Speech:  Speech is normal; fluent and spontaneous with normal  comprehension.  Cognition:  The patient is oriented to person, place, and time;   Cranial Nerves:Hypomimia  The pupils are equal, round, and reactive to light. The fundi are flat. Visual fields are full to finger confrontation. Impaired upgaze, otherwise extraocular movements are intact. Trigeminal sensation is intact and the muscles of mastication are normal. The face is symmetric. The palate elevates in the midline. Hearing intact. Voice is normal. Shoulder shrug is normal. The tongue has normal motion without fasciculations.   Coordination:  Right arm bradykinesia compared to left  Gait:  Decreased arm right swing and re-emergent right hand tremor.   Motor Observation:  right resting tremor with some postural components Tone:  Cogwheeling upper right extremity with facilitation.  Posture:  Slightly stooped.   Strength:  Strength is V/V in the upper and lower limbs.    Sensation: intact to LT    Assessment/Plan: 73 y.o. male here as a referral from Dr. Doreene Nest for resting tremor. He has a PMHx of Schizoaffective disorder on Seroquel, depression, anxiety, TIA, MI, Crohn's Dz, thrombocytopenia, CKD, glucose intolerance who is here for eval of tremor and memory changes. Neuro exam significant for resting tremor with postural components, right arm cogwheeling with facilitation, decreased right arm swing on walking with emergent tremor, decreased blink reflex, right hand bradykinesia. Consistent with parkinsonism. Patient is on dopamine blocking agent, unclear if this is secondary to medication effect or if he is developing idiopathic PD, other neurodegenerative disorder. However seroquel is the least likely of the atypical antidepressants to cause extrapyramidal side effects. MRi of the brain normal. DAT Scan was c/w PD.  Medications tried: Sinemet (rash, had to stop), Mirapex, Neupro, Artane, amantadine, Requip.  DAT scan was performed to try and  evaluate for the different causes of parkinsonism, c/w Parkinson's disease (markedly abnormal DAT scan with significantly decreased tracer on the bilateral putamen and left caudate. This pattern of activity can be seen with Parkinson's disease or related syndromes.).   At this point I will refer him for Deep Brain Stimulation or other second opinion from Dr. Wells Guiles Tat who is the best movement disorder specialist in this area.   Continue current medications  May consider trying Rytary next, but had rash with Sinemet with ask Dr. Doristine Devoid Opinion.  Sarina Ill, MD  [facility] [address] [place], [place] [address]  Phone [phone] Fax [phone]

## 2018-09-08 ENCOUNTER — Telehealth: Payer: Self-pay | Admitting: Neurology

## 2018-09-08 ENCOUNTER — Other Ambulatory Visit: Payer: Self-pay | Admitting: Neurology

## 2018-09-08 MED ORDER — RYTARY 23.75-95 MG PO CPCR: 1.0000 | ORAL_CAPSULE | Freq: Three times a day (TID) | ORAL | 3 refills | Status: DC

## 2018-09-08 NOTE — Telephone Encounter (Addendum)
Spoke with pt's wife and discussed the message from Dr. Jaynee Eagles in detail regarding Rytary. Reviewed instructions and dosage and asked for an update in 2 weeks to discuss possible increase in dose. She verbalized understanding and asked for the Rytary to be sent to [facility] mail order since they are on lockdown and cannot get to [place]. She also asked about scheduling a 3-4 month f/u with Dr. Jaynee Eagles. I scheduled pt for Tues 9/15 @ 2:00 pm.  Rytary sent to [facility].

## 2018-09-08 NOTE — Progress Notes (Signed)
I am prescribing rytary as we discussed in the last appointment. This is similar to Sineet so stop immediately for rash.  I am not sure if he had a rash to the sinemet or possibly to a filler in the medication so we need to be careful, he may want to start with one pill a day and slowly increase to 3 pills a day. Please call us or email 2 weeks and if no side effects we can further increase.  Also, I spoke with Wells Guiles Tat and Rush Landmark may not be a good candidate for Deep Brain Stimulation given his high dose seroquel and co-morbid conditions. Let;s try Rytary first. thanks

## 2018-09-08 NOTE — Addendum Note (Signed)
Addended by: Gildardo Griffes on: 09/08/2018 03:34 PM   Modules accepted: Orders

## 2018-09-08 NOTE — Telephone Encounter (Signed)
Romelle Starcher, would you call Rush Landmark and his wife please? I am prescribing rytary as we discussed in the last appointment. This is similar to Sinemet so stop immediately for rash.  I am not sure if he had a rash to the sinemet or possibly to a filler in the medication so we need to be careful, he may want to start with one pill a day and slowly increase to 3 pills a day. Please call us or email 2 weeks and if no side effects we can further increase.  Also, I spoke with Dr. Wells Guiles Tat and Rush Landmark may not be a good candidate for Deep Brain Stimulation given his high dose seroquel and co-morbid conditions. Let's try Rytary first. thanks

## 2018-09-09 ENCOUNTER — Telehealth: Payer: Self-pay | Admitting: *Deleted

## 2018-09-09 NOTE — Telephone Encounter (Signed)
Completed Rytary PA on CMM. KEY: [id number]. Approved instantly from 09/09/2018 through 03/25/2019.    PA Case: [id number], Status: Approved, Coverage Starts on: 03/25/2018 12:00:00 AM, Coverage Ends on: 03/25/2019 12:00:00 AM. Questions? Contact [phone].

## 2018-09-15 NOTE — Telephone Encounter (Signed)
Luke Bennett, is there a program for them to get a month of free samples or for patient assistance for rytary?

## 2018-09-15 NOTE — Telephone Encounter (Signed)
Let me look into Rytary  Samples

## 2018-09-15 NOTE — Telephone Encounter (Signed)
Pt's wife Nunzio Cory called and stated the Rytary is $250 for one month and about $400 for 3 months. She asked if samples were possible. They are still on lockdown however.

## 2018-09-16 NOTE — Telephone Encounter (Signed)
Called REP and she stated she had left the company she gave me a name for new REP Velva Harman [phone] for [place] I have called and left her a message asking her to call me back.

## 2018-09-23 ENCOUNTER — Other Ambulatory Visit: Payer: Self-pay | Admitting: Neurology

## 2018-09-23 MED ORDER — RYTARY 23.75-95 MG PO CPCR: 1.0000 | ORAL_CAPSULE | Freq: Three times a day (TID) | ORAL | 3 refills | Status: DC

## 2018-09-23 NOTE — Telephone Encounter (Signed)
Prescription and forms signed by Dr. Jaynee Eagles and taken to [facility].

## 2018-09-23 NOTE — Telephone Encounter (Signed)
Looks like he got approved from patient assistance, do we have to get the samples then? Can he just get the medicaine from patient assistance and start? thanks

## 2018-09-23 NOTE — Telephone Encounter (Signed)
Dr. Jaynee Eagles I sent you a e-mail to sign up for Rytary samples you have to do  via on line .  I have Paper work for patient assistance ready patient cant leave his house he is quarantined for the next 14 days. He will sign his part and mail back to me process will take 7-10 day turn around thanks [place].

## 2018-09-23 NOTE — Telephone Encounter (Signed)
Waiting on Patient to mail back his part and then I will submit all paper work thanks [employer]

## 2018-09-24 NOTE — Telephone Encounter (Signed)
Pts wife Juliann Pulse is requesting a call back from [place]

## 2018-09-28 NOTE — Telephone Encounter (Signed)
I returned Kathy's call. She asked about samples and stated this afternoon they received the part of the application for pt to compete and mail back. I advised I would see if we had samples, but also discussed pt may start medication after the assistance is worked out. She was fine either way. She verbalized appreciation for the call.

## 2018-09-29 NOTE — Telephone Encounter (Signed)
Spoke with Dr. Jaynee Eagles. We do not have samples. Pt can start the [place] when he receives it from patient assistance.

## 2018-09-30 NOTE — Telephone Encounter (Signed)
Spoke with Juliann Pulse, pt's wife today and gave her Dr. Cathren Laine message. She verbalized understanding and stated they had put the form in the mail today.

## 2018-10-07 NOTE — Telephone Encounter (Signed)
Received paper work will fax off today for [place]. Fax [phone]- telephone [phone]

## 2018-10-08 NOTE — Telephone Encounter (Signed)
Processing

## 2018-10-27 NOTE — Telephone Encounter (Signed)
Noted thanks °

## 2018-10-27 NOTE — Telephone Encounter (Signed)
Patient was approved and patient was approved for 14 days worth.  Patient started the RX and patient's has started today and if he is doing well. Patient's wife will call back with an up date and if RX is working she will have to submit on her own to patient assistance programs that Rytary gave her three company's she will have to try.

## 2018-10-28 NOTE — Telephone Encounter (Signed)
Noted thanks °

## 2018-10-28 NOTE — Telephone Encounter (Signed)
I called and spoke to Mrs Conlee and relayed Dr. Cathren Laine message below. Dr. Jaynee Eagles and  Romelle Starcher she is going to hold off on referral for now DBS  Because of transportation . Patient is scheduled to follow up in November with Dr. Jaynee Eagles Via  Telephone call.  I have relayed to Mrs. Keeny she needs to keep [place] updated on Rytary and to please try if she could arrange transportation to go to [facility] [place] for DBS Mrs Huish has my direct number.   Hinton Dyer - Please let patient know that If he does well we can increase the Rytary as well so keep [place] informed and make sure he has a follow up with me in 3 months. Also we discussed deep brain stimulation and the only option is [facility] (not Dr. Carles Collet unfortunately who is here in town). If they want referral to [facility] we can send them, I highly encourage them to at least go to [facility] [place] just a few times and explore options - even if they say no to DBS they should know what is available to them and also the movement team up there can make recommendations as well for medication management in case they suggest some thing we have not thought of. If they agree please place referral to [facility] for DBS or let me know and I can do it.  Thanks.[facility] fyi)

## 2018-10-28 NOTE — Telephone Encounter (Signed)
Hinton Dyer - Please let patient know that If he does well we can increase the Rytary as well so keep [place] informed and make sure he has a follow up with me in 3 months. Also we discussed deep brain stimulation and the only option is [facility] (not Dr. Carles Collet unfortunately who is here in town). If they want referral to [facility] we can send them, I highly encourage them to at least go to [facility] even just a few times and explore options - even if they say no to DBS they should know what is available to them and also the movement team up there can make recommendations as well for medication management in case they suggest some thing we have not thought of. If they agree please place referral to [facility] for DBS or let me know and I can do it.  Thanks.[facility] fyi)

## 2018-11-02 ENCOUNTER — Telehealth: Payer: Self-pay | Admitting: Neurology

## 2018-11-02 NOTE — Telephone Encounter (Signed)
Pt wife is asking for a call from RN to discuss Carbidopa-Levodopa ER (RYTARY) 23.75-95 MG CPCR

## 2018-11-03 NOTE — Telephone Encounter (Signed)
I would take it earlier that 11pm maybe 7pm thanks but that is fine

## 2018-11-03 NOTE — Telephone Encounter (Signed)
I returned Kathy's call. She stated the pt has been on Rytary once daily >1 week. Some days he is groggy and some he is not. She feels comfortable increasing to twice daily x 1 week starting tomorrow. If he tolerates she will increase to TID with a goal schedule of 7 AM, 3 PM, and 11 PM. She stated this had been discussed in the past with Dr. Jaynee Eagles and d/t pt's hx of sensitivity of medications, pt's wife felt a slow titration was best. She is also going to try to pay for it OOP, but will let us know if she changes her mind on the assistance and will let us know if patient has any problems with the increased dose. She verbalized appreciation for the call.

## 2018-11-05 NOTE — Telephone Encounter (Signed)
Spoke with Dr. Jaynee Eagles. Pt's wife had planned to give him the Rytary every 8 hours and Dr. Jaynee Eagles said that was fine.

## 2018-11-19 ENCOUNTER — Other Ambulatory Visit: Payer: Self-pay | Admitting: Neurology

## 2018-11-19 DIAGNOSIS — G2571 Drug induced akathisia: Secondary | ICD-10-CM

## 2018-11-19 DIAGNOSIS — G20 Parkinson's disease: Secondary | ICD-10-CM

## 2018-11-19 DIAGNOSIS — G2581 Restless legs syndrome: Secondary | ICD-10-CM

## 2018-11-19 DIAGNOSIS — T50905A Adverse effect of unspecified drugs, medicaments and biological substances, initial encounter: Secondary | ICD-10-CM

## 2018-11-19 DIAGNOSIS — G253 Myoclonus: Secondary | ICD-10-CM

## 2018-12-08 ENCOUNTER — Ambulatory Visit: Payer: Medicare HMO | Admitting: Neurology

## 2018-12-26 IMAGING — CR DG CHEST 2V
2 series · 2 of 2 positions shown · non-contrast
Comparison: None.

CLINICAL DATA: Cough and shortness of breath for 1 month.

EXAM:
CHEST  2 VIEW

[w chest pa]
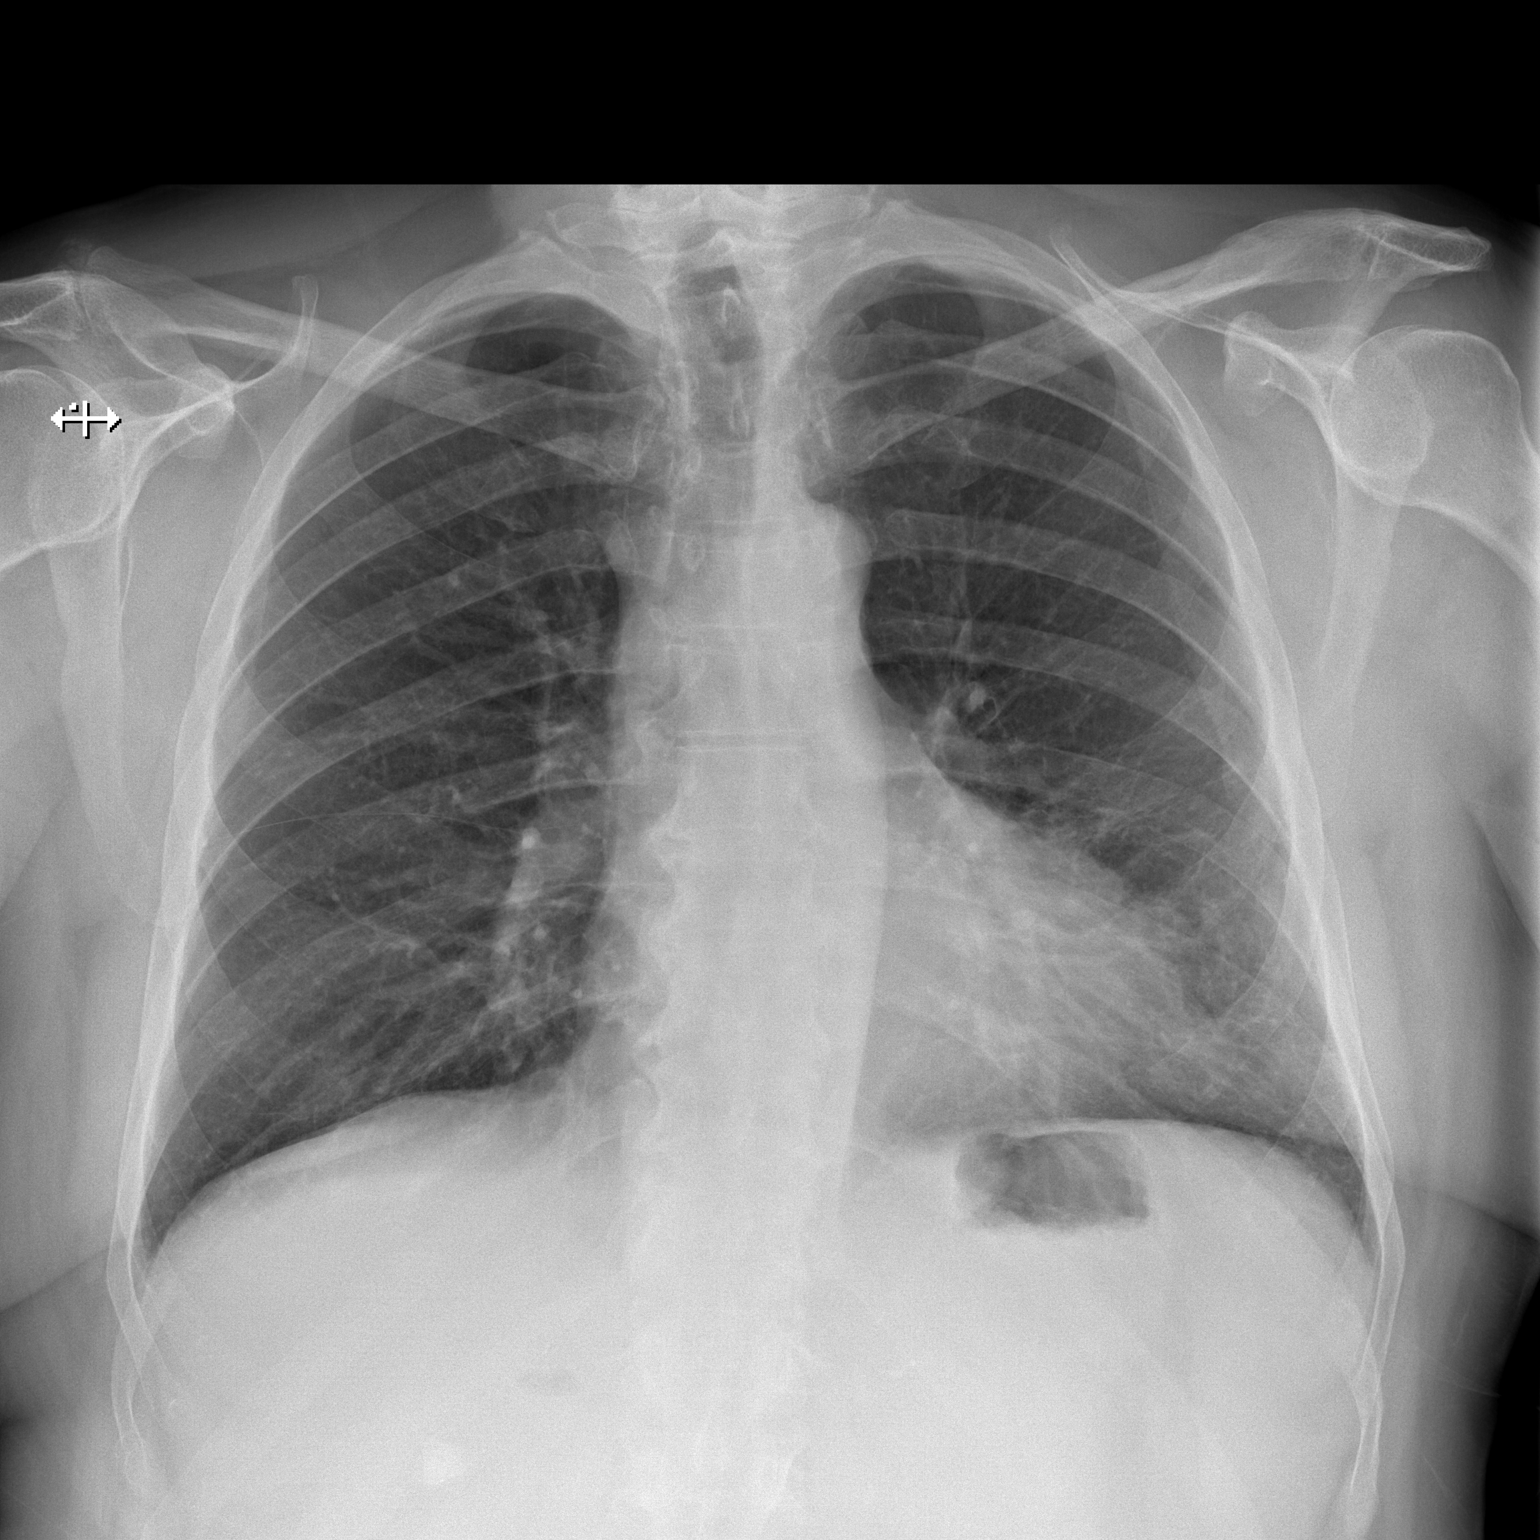

[w chest lat]
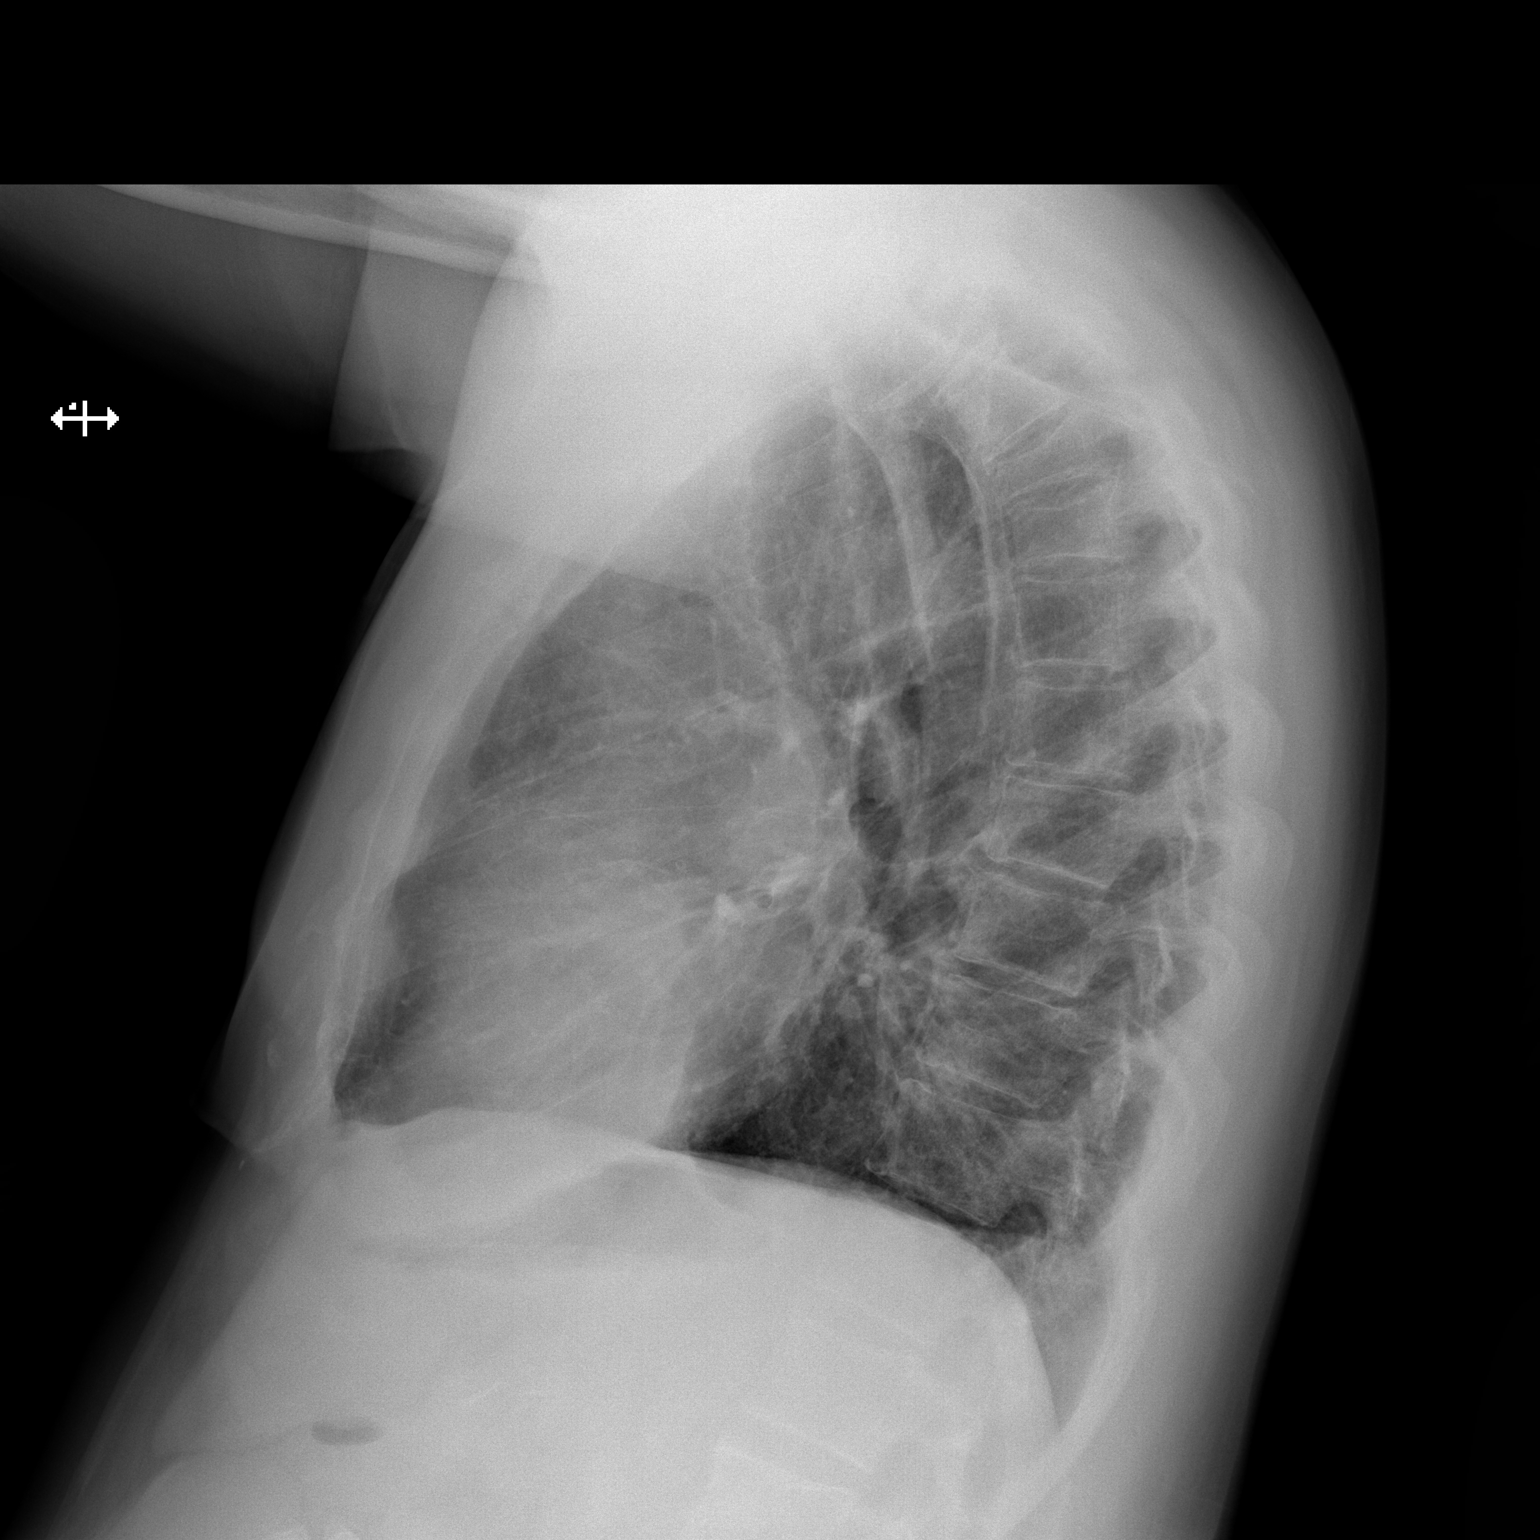

[2 of 2 positions shown; findings below may reference images not displayed]

FINDINGS: The heart size and mediastinal contours are within normal limits.
Mild linear opacity of the left lung base. There is no pulmonary
edema or pleural effusion. The visualized skeletal structures are
unremarkable.
IMPRESSION: Mild linear opacity of the left lung base favor linear atelectasis.

## 2019-02-03 ENCOUNTER — Ambulatory Visit: Payer: Medicare HMO | Admitting: Neurology

## 2019-04-19 ENCOUNTER — Other Ambulatory Visit: Payer: Self-pay | Admitting: Neurology

## 2019-04-29 NOTE — Telephone Encounter (Signed)
Received a renewal PA for Rytary. Per message from plan: available without authorization. KEY: [id number].

## 2019-05-24 ENCOUNTER — Telehealth: Payer: Self-pay | Admitting: Neurology

## 2019-05-24 DIAGNOSIS — G20 Parkinson's disease: Secondary | ICD-10-CM

## 2019-05-24 NOTE — Telephone Encounter (Signed)
Turpin,Kathy(wife on DPR) has called re: pt's  Rotigotine (NEUPRO) 8 MG/24HR PT24 She would like a call about coming in for samples while waiting on the mail order delivery.  Pt will be out as of Wed.  Please call

## 2019-05-25 ENCOUNTER — Other Ambulatory Visit: Payer: Self-pay | Admitting: Neurology

## 2019-05-25 DIAGNOSIS — G20 Parkinson's disease: Secondary | ICD-10-CM

## 2019-05-25 MED ORDER — NEUPRO 4 MG/24HR TD PT24: 2.0000 | MEDICATED_PATCH | Freq: Every day | TRANSDERMAL | 0 refills | Status: DC

## 2019-05-25 MED ORDER — NEUPRO 8 MG/24HR TD PT24: 1.0000 | MEDICATED_PATCH | Freq: Every day | TRANSDERMAL | 11 refills | Status: DC

## 2019-05-25 NOTE — Addendum Note (Signed)
Addended by: Gildardo Griffes on: 05/25/2019 12:31 PM   Modules accepted: Orders

## 2019-05-25 NOTE — Telephone Encounter (Signed)
Yes, thanks

## 2019-05-25 NOTE — Telephone Encounter (Signed)
Dr. Jaynee Eagles aware we are out of 8 mg samples. Will use 4 mg and have pt apply two daily. Sample order placed for 7 boxes to try to get patient through the end of this month while waiting for mail order.

## 2019-05-25 NOTE — Telephone Encounter (Signed)
Patient's wife Yousef Huge (on [place]) came by office and picked up Neupro samples. Identified by checking her license. She understands they are 4 mg each patch and the patient should apply two of these daily for total dose of 8 mg. She understands they expire at the end of this month. I let her know the prescription refills were sent to [facility] patient assistance today. I encouraged her to schedule a follow-up visit for around June of this year unless needed earlier. She verbalized appreciation.

## 2019-05-25 NOTE — Addendum Note (Signed)
Addended by: Gildardo Griffes on: 05/25/2019 01:30 PM   Modules accepted: Orders

## 2019-05-25 NOTE — Telephone Encounter (Signed)
I called pt's wife Juliann Pulse back. Pt gets Neupro through patient assistance. They need new refills sent. She said she was told they aren't sure how soon they can get the refill mailed to her. She appreciates the sample. She will come by this afternoon and is aware to bring her ID. She said the pt seems to be doing fine on Rytary. He still has some symptoms. He takes that and his patch faithfully. She will schedule a follow-up soon.  I called [facility] patient assistance to confirm fax to send pt's Neupro to. Spoke with [place]. Fax # for prescription refills is [phone].

## 2019-05-25 NOTE — Telephone Encounter (Signed)
Wife called again. She would like to speak to RN today before noon about the pt's Rotigotine (NEUPRO) 8 MG/24HR PT24 Please advise.

## 2019-05-25 NOTE — Telephone Encounter (Signed)
Neupro 8 mg patch prescription signed by Dr Jaynee Eagles & faxed to [facility] patient assistance [phone]. Received a receipt of confirmation.

## 2019-05-25 NOTE — Addendum Note (Signed)
Addended by: Gildardo Griffes on: 05/25/2019 10:17 AM   Modules accepted: Orders

## 2019-05-25 NOTE — Telephone Encounter (Signed)
Note [facility] patient assistance # is [phone].

## 2019-06-07 ENCOUNTER — Other Ambulatory Visit: Payer: Self-pay | Admitting: Neurology

## 2019-06-15 IMAGING — US US RENAL
1 series · 14 of 25 positions shown · non-contrast
Comparison: None.

CLINICAL DATA: Flank pain and acute renal failure.

EXAM:
RENAL / URINARY TRACT ULTRASOUND COMPLETE

[Series 1: us renal · 0.25mm/px · 14 of 28 slices shown]
[im 1/28]
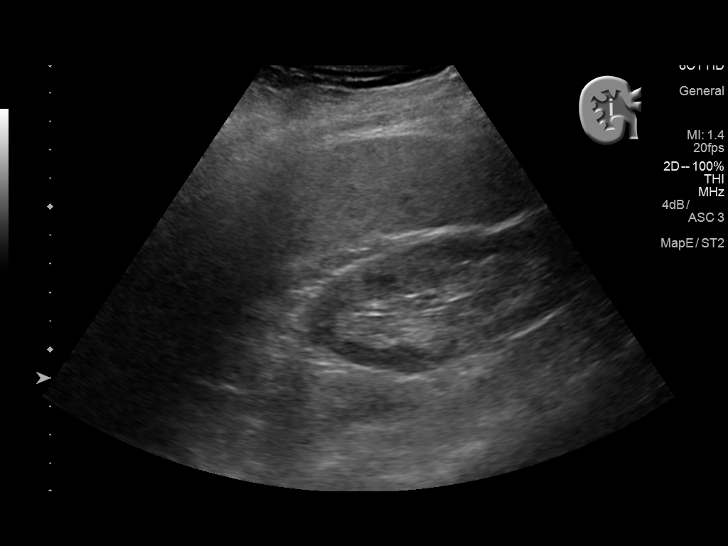
[im 3/28]
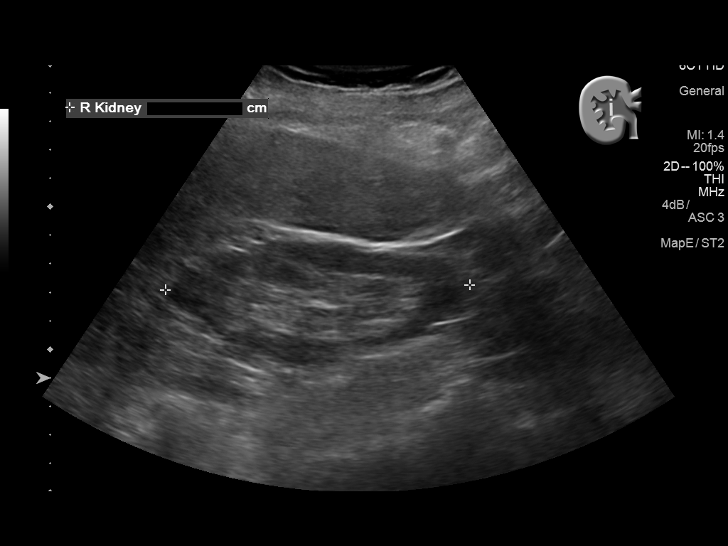
[im 5/28]
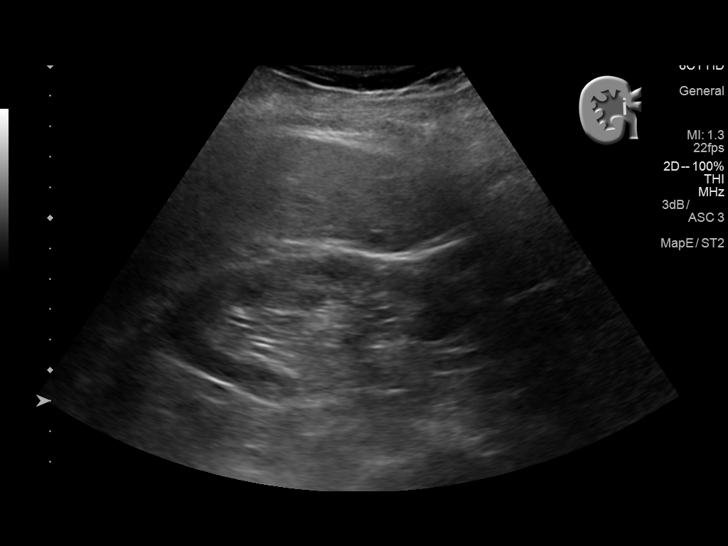
[im 7/28]
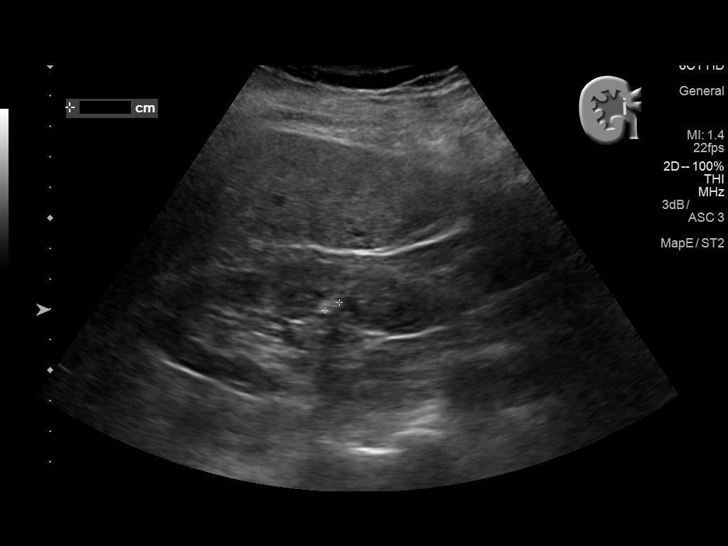
[im 10/28]
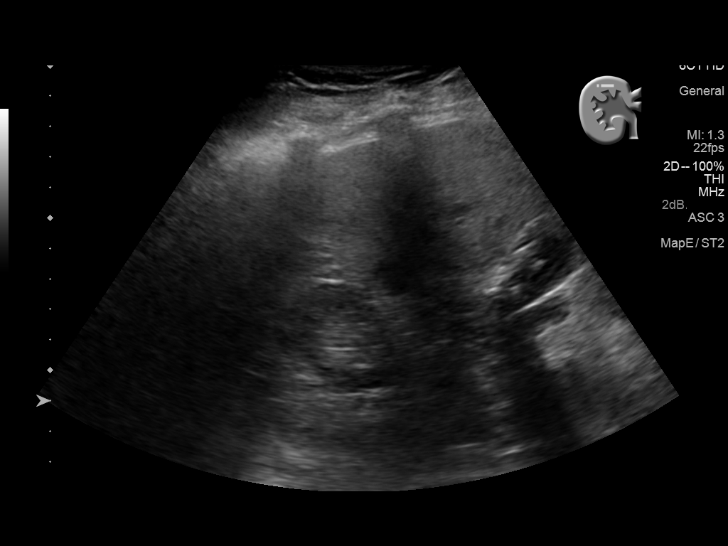
[im 11/28]
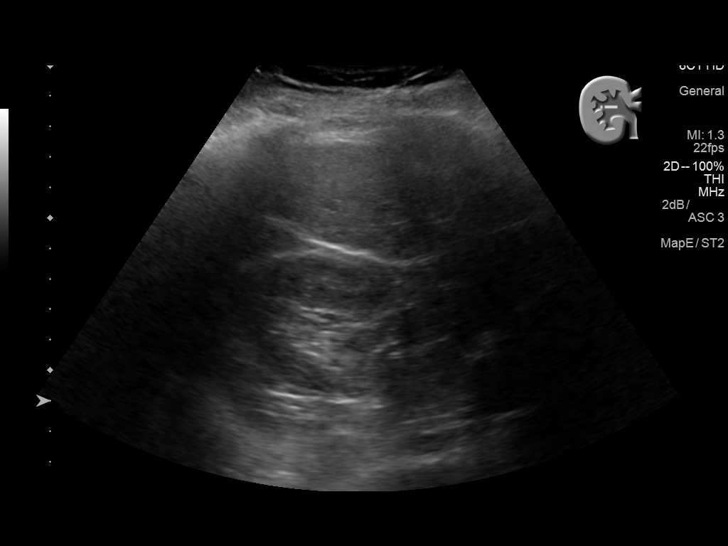
[im 13/28]
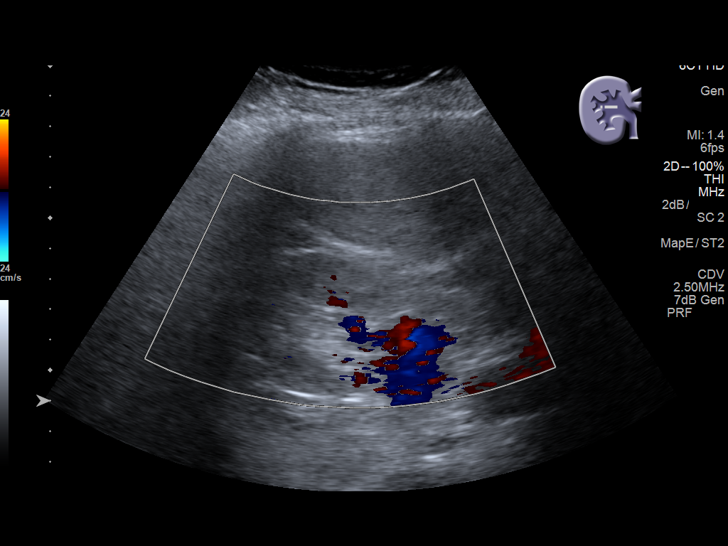
[im 15/28]
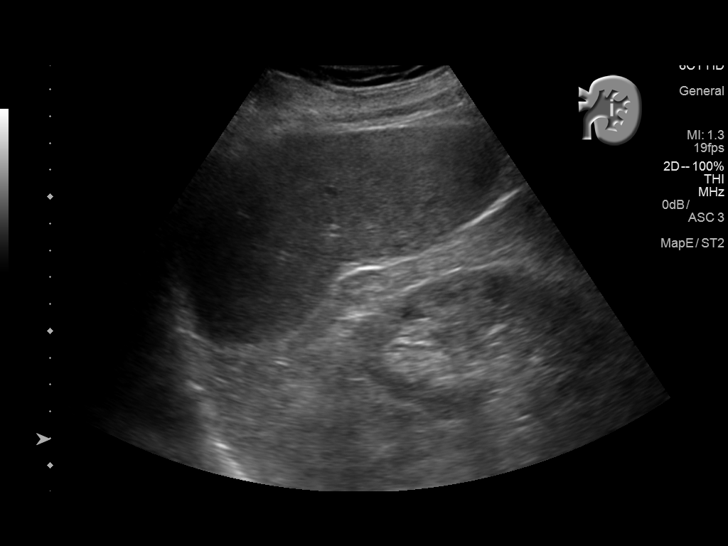
[im 17/28]
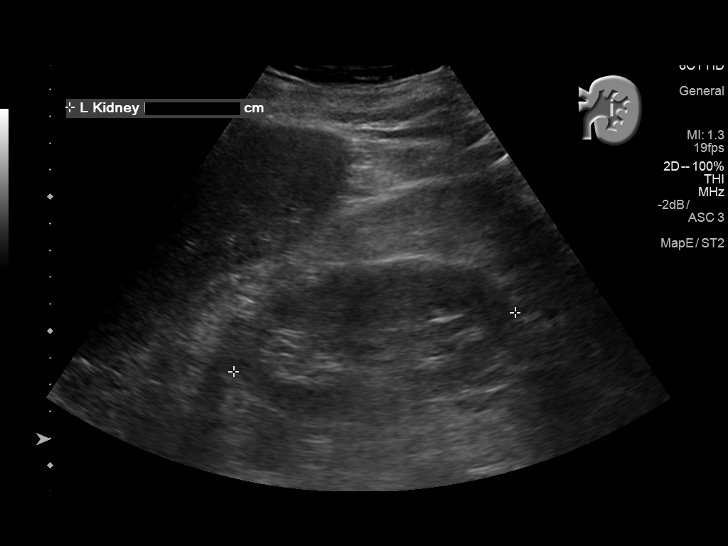
[im 19/28]
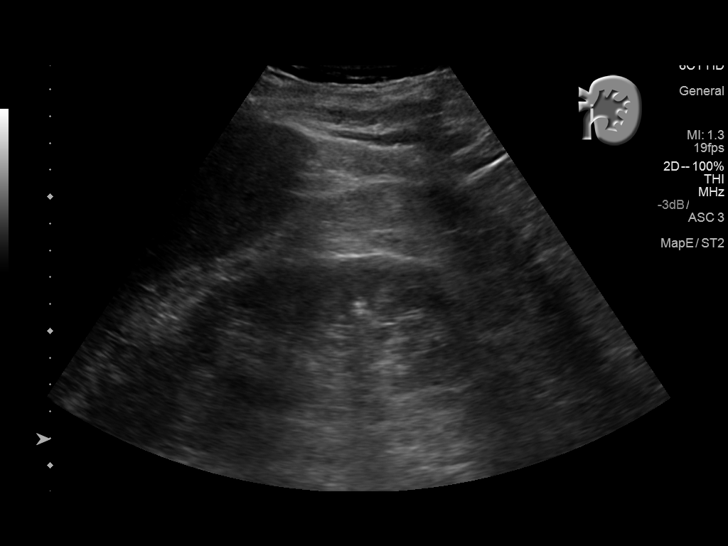
[im 21/28]
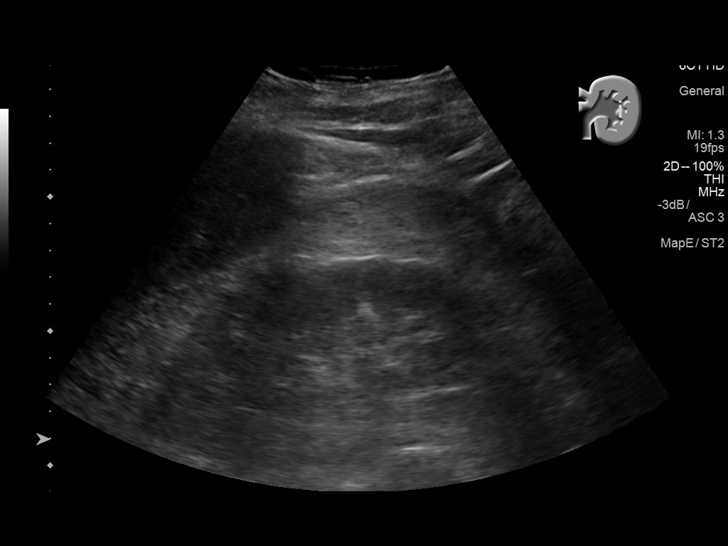
[im 23/28]
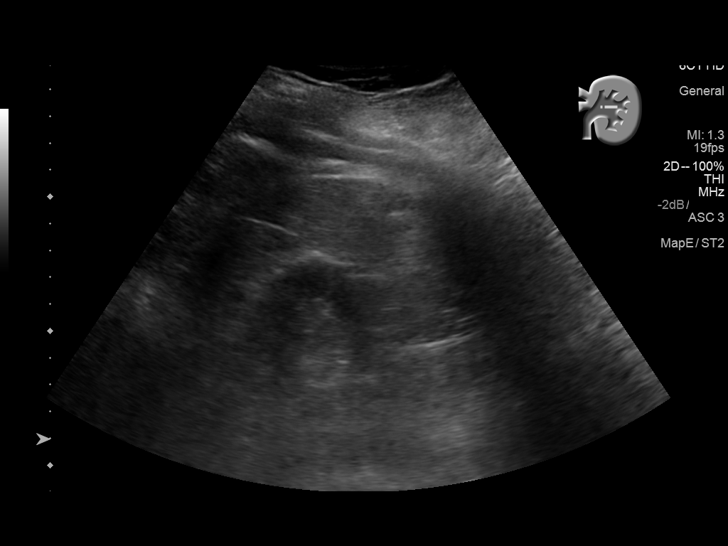
[im 25/28]
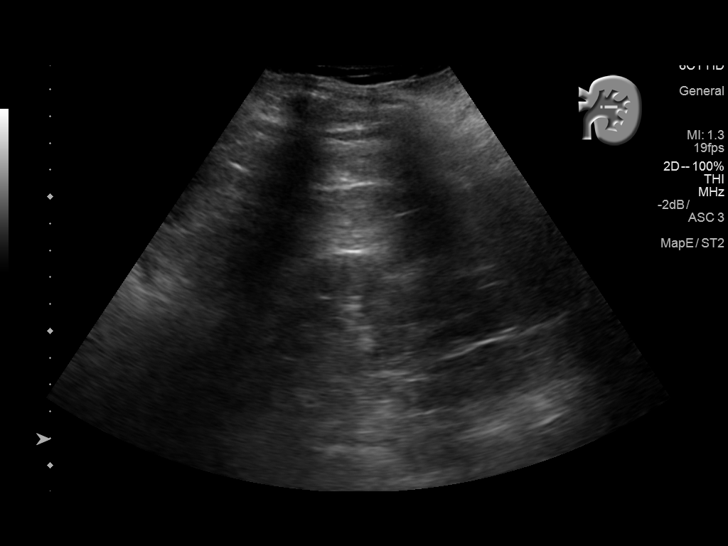
[im 28/28]
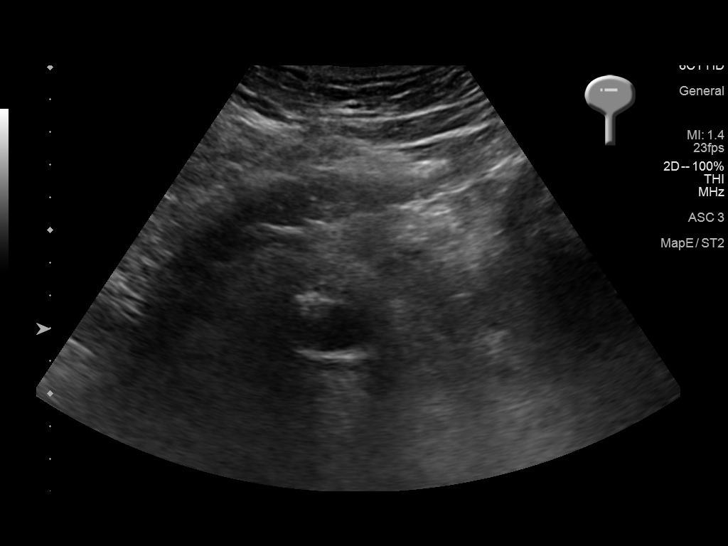

[14 of 25 positions shown; findings below may reference images not displayed]

FINDINGS: Right Kidney:

Length: 10.7 cm. Small 5.3 mm nonobstructing calculus is identified.
Echogenicity within normal limits. No mass or hydronephrosis
visualized.

Left Kidney:

Length: 10.7 cm. Small 5.3 mm nonobstructing calculus is identified.
Echogenicity within normal limits. No mass or hydronephrosis
visualized.

Bladder:

Foley catheter is identified [REDACTED]ompressed bladder.
IMPRESSION: Small nonobstructing stones in both kidneys. No hydronephrosis
bilaterally.

## 2019-07-06 ENCOUNTER — Other Ambulatory Visit: Payer: Self-pay | Admitting: Neurology

## 2019-07-24 ENCOUNTER — Encounter (HOSPITAL_COMMUNITY): Payer: Self-pay | Admitting: Emergency Medicine

## 2019-07-24 ENCOUNTER — Emergency Department (HOSPITAL_BASED_OUTPATIENT_CLINIC_OR_DEPARTMENT_OTHER): Payer: Medicare HMO

## 2019-07-24 ENCOUNTER — Emergency Department (HOSPITAL_COMMUNITY)
Admission: EM | Admit: 2019-07-24 | Discharge: 2019-07-24 | Disposition: A | Discharge status: Home / Self Care | Payer: Medicare HMO | Attending: Emergency Medicine | Admitting: Emergency Medicine

## 2019-07-24 ENCOUNTER — Other Ambulatory Visit: Payer: Self-pay

## 2019-07-24 DIAGNOSIS — M7989 Other specified soft tissue disorders: Secondary | ICD-10-CM | POA: Diagnosis not present

## 2019-07-24 DIAGNOSIS — N183 Chronic kidney disease, stage 3 unspecified: Secondary | ICD-10-CM | POA: Insufficient documentation

## 2019-07-24 DIAGNOSIS — I2510 Atherosclerotic heart disease of native coronary artery without angina pectoris: Secondary | ICD-10-CM | POA: Insufficient documentation

## 2019-07-24 DIAGNOSIS — Z86711 Personal history of pulmonary embolism: Secondary | ICD-10-CM | POA: Diagnosis not present

## 2019-07-24 DIAGNOSIS — G20 Parkinson's disease: Secondary | ICD-10-CM | POA: Insufficient documentation

## 2019-07-24 DIAGNOSIS — Z79899 Other long term (current) drug therapy: Secondary | ICD-10-CM | POA: Insufficient documentation

## 2019-07-24 DIAGNOSIS — Z86718 Personal history of other venous thrombosis and embolism: Secondary | ICD-10-CM | POA: Diagnosis not present

## 2019-07-24 DIAGNOSIS — R2241 Localized swelling, mass and lump, right lower limb: Secondary | ICD-10-CM | POA: Diagnosis not present

## 2019-07-24 LAB — BASIC METABOLIC PANEL
Anion gap: 7 (ref 5–15)
BUN: 19 mg/dL (ref 8–23)
CO2: 28 mmol/L (ref 22–32)
Calcium: 9 mg/dL (ref 8.9–10.3)
Chloride: 103 mmol/L (ref 98–111)
Creatinine, Ser: 1.11 mg/dL (ref 0.61–1.24)
GFR calc Af Amer: >60 mL/min (ref >60)
GFR calc non Af Amer: >60 mL/min (ref >60)
Glucose, Bld: 106 mg/dL — ABNORMAL HIGH (ref 70–99)
Potassium: 4.1 mmol/L (ref 3.5–5.1)
Sodium: 138 mmol/L (ref 135–145)

## 2019-07-24 LAB — CBC WITH DIFFERENTIAL/PLATELET
Abs Immature Granulocytes: 0.01 10*3/uL (ref 0.00–0.07)
Basophils Absolute: 0 10*3/uL (ref 0.0–0.1)
Basophils Relative: 1 %
Eosinophils Absolute: 0.2 10*3/uL (ref 0.0–0.5)
Eosinophils Relative: 4 %
HCT: 41.4 % (ref 39.0–52.0)
Hemoglobin: 13.6 g/dL (ref 13.0–17.0)
Immature Granulocytes: 0 %
Lymphocytes Relative: 29 %
Lymphs Abs: 1.4 10*3/uL (ref 0.7–4.0)
MCH: 33.7 pg (ref 26.0–34.0)
MCHC: 32.9 g/dL (ref 30.0–36.0)
MCV: 102.5 fL — ABNORMAL HIGH (ref 80.0–100.0)
Monocytes Absolute: 0.4 10*3/uL (ref 0.1–1.0)
Monocytes Relative: 9 %
Neutro Abs: 2.8 10*3/uL (ref 1.7–7.7)
Neutrophils Relative %: 57 %
Platelets: 113 10*3/uL — ABNORMAL LOW (ref 150–400)
RBC: 4.04 MIL/uL — ABNORMAL LOW (ref 4.22–5.81)
RDW: 13.1 % (ref 11.5–15.5)
WBC: 4.9 10*3/uL (ref 4.0–10.5)
nRBC: 0 % (ref 0.0–0.2)

## 2019-07-24 NOTE — ED Provider Notes (Signed)
[facility] DEPT Provider Note   CSN: [id number] Arrival date & time: 07/24/19  1654     History Chief Complaint  Patient presents with  . Leg Swelling    Luke Bennett is a 74 y.o. male past medical 3 of BPH, CKD, CAD, Crohn's, DVT (of upper extremity after surgery), PE who presents today for evaluation of right lower extremity swelling x2 weeks.  He states that over the last 2 weeks, his right lower extremity has been swelling.  He denies any preceding trauma, injury, fall.  He has not noticed any overlying warmth, erythema, pain.  He states that he went to urgent care earlier today for evaluation of symptoms and was told to come to the emergency department for further evaluation.  He denies any fevers, chest pain, difficulty breathing, numbness/weakness.  The history is provided by the patient.       Past Medical History:  Diagnosis Date  . Anxiety   . Arthritis   . BPH with urinary obstruction   . CKD (chronic kidney disease), stage III   . Coronary artery disease    cardiologist-  dr Atilano Median [facility] cardiology [facility]) per lov note dated 05/30/2016  pt hx MI approx. 2001 had cardiac cath in [place] native vessel disease no intervention   . Crohn's disease ([place])    followed by dr c. Cindee Salt (digestive specialist w/ [facility] in [place])-- dx approx. 2003 s/p  proctocolecotmy w/ ostomy and colectomy in summer 2012 for bowel obstruction;  treated w/ Humira injection's  . Depression   . Drug-induced cytopenia    hx previous treatment w/ 6MP  . Drug-induced pancytopenia (HCC)    hx previous treatment w/ 6MP  . Eczema   . Foley catheter in place   . Gait disturbance    secondary to parkinson's  . GERD (gastroesophageal reflux disease)   . History of DVT (deep vein thrombosis)    post op surgery 2012-- upper extremity--  treated w/ coumadin 6 months  . History of MI (myocardial infarction) 2001  . History of pulmonary embolus (PE)    2003 and  2012-- post surgery --  treated w/ coumadin for 6 months  . History of transient ischemic attack (TIA)    2012--- no residual  . Ileostomy in place [facility])   . Iron deficiency anemia   . NAFL (nonalcoholic fatty liver)    followed by dr c. Cindee Salt (digestive specialist w/ [facility])  lov note in epic dated 11-20-2016,  stage 0 fibriosis  . Nephrolithiasis    bilateral nonobstructive stones for Renal US dated 04-05-2017  . Parkinson's disease [facility])    neurologist-  dr Bethena Midget ([place] [facility])  . Pre-diabetes   . RLS (restless legs syndrome)   . Schizoaffective disorder ([place])   . 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 Tremor    secondary to parkinson's  . Urinary retention   . Wears glasses     Patient Active Problem List   Diagnosis Date Noted  . RLS (restless legs syndrome) 05/20/2018  . SOB (shortness of breath) 04/05/2017  . AKI (acute kidney injury) ([place]) 04/05/2017  . Catheter-associated urinary tract infection ([place]) 04/05/2017  . Schizoaffective disorder ([place]) 04/05/2017  . Parkinson's disease ([place]) 05/02/2015  . Parkinsonism ([place]) 12/29/2014  . Tremor 09/28/2014  . Ataxia 09/28/2014  . Memory loss 09/28/2014  . Subacute confusional state 09/28/2014  . TIA (transient ischemic attack) 09/28/2014  Past Surgical History:  Procedure Laterality Date  . ABDOMINOPERINEAL PROCTOCOLECTOMY  2003   W/  CREATION OSTOMY  . CARDIAC CATHETERIZATION  2001 approx. in [place]   native vessel disease (per dr Atilano Median note dated 05-30-2016 , cardiologist), in epic)  . COLECTOMY  summer 2012  . CYSTOSCOPY WITH INSERTION OF UROLIFT N/A 06/16/2017   Procedure: CYSTOSCOPY WITH INSERTION OF UROLIFT;  Surgeon: Cleon Gustin, MD;  [facility];  Service: Urology;  Laterality: N/A;  . TONSILLECTOMY  child  . TRANSTHORACIC ECHOCARDIOGRAM  ECHO 03-27-2017   ef 85-88%, grade 1 diastolic  dysfunction       Family History  Problem Relation Age of Onset  . Cerebral aneurysm Father   . Hypertension Sister   . Hodgkin's lymphoma Sister   . Parkinsonism Neg Hx     Social History   Tobacco Use  . Smoking status: Former Smoker    Years: 2.00    Types: Cigarettes    Quit date: 03/25/1969    Years since quitting: 50.3  . Smokeless tobacco: Never Used  Substance Use Topics  . Alcohol use: No    Alcohol/week: 0.0 standard drinks  . Drug use: No    Home Medications Prior to Admission medications   Medication Sig Start Date End Date Taking? Authorizing Provider  adalimumab (HUMIRA PEN) 40 MG/0.8ML injection Inject 40 mg into the skin every 14 (fourteen) days. On Monday's    [provider]  aspirin EC 81 MG tablet Take 81 mg by mouth daily.    [provider]  Cholecalciferol (VITAMIN D3) 400 units CAPS Take by mouth daily.    [provider]  Multiple Vitamin (MULTIVITAMIN WITH MINERALS) TABS tablet Take 1 tablet by mouth daily.    [provider]  oxybutynin (DITROPAN) 5 MG tablet Take 5 mg by mouth. 04/06/17   [provider]  Prenatal Vit-Fe Fumarate-FA (MULTIVITAMIN-PRENATAL) 27-0.8 MG TABS tablet Take 2 tablets by mouth daily at 12 noon.    [provider]  QUEtiapine (SEROQUEL) 300 MG tablet Take 300 mg by mouth at bedtime.  09/05/14   [provider]  rOPINIRole (REQUIP) 0.5 MG tablet TAKE up to 3 TABLETS BY MOUTH AT BEDTIME as needed for Restless legs. 11/26/18   Melvenia Beam, MD  rotigotine (NEUPRO) 4 MG/24HR Place 2 patches onto the skin daily. Removed old patch(es) with each new application. 05/25/19   Melvenia Beam, MD  Rotigotine (NEUPRO) 8 MG/24HR PT24 Place 1 patch onto the skin daily. 05/25/19   Melvenia Beam, MD  RYTARY [phone] MG CPCR TAKE 1 CAPSULE THREE TIMES DAILY 07/07/19   Melvenia Beam, MD  trihexyphenidyl (ARTANE) 2 MG tablet Take 1 tablet (2 mg total) by mouth every  evening. Patient not taking: Reported on 08/26/2018 05/20/18   Debbora Presto, NP    Allergies    Carbidopa w-levodopa, Aspirin, Celecoxib, Bactrim [sulfamethoxazole-trimethoprim], and Sinemet [carbidopa-levodopa]  Review of Systems   Review of Systems  Constitutional: Negative for fever.  Respiratory: Negative for cough and shortness of breath.   Cardiovascular: Positive for leg swelling. Negative for chest pain.  Gastrointestinal: Negative for abdominal pain, nausea and vomiting.  Genitourinary: Negative for dysuria and hematuria.  Skin: Negative for color change.  Neurological: Negative for weakness, numbness and headaches.  All other systems reviewed and are negative.   Physical Exam Updated Vital Signs BP (!) 141/84   Pulse 85   Temp 99 F (37.2 C) (Oral)  Resp 17   SpO2 97%   Physical Exam Vitals and nursing note reviewed.  Constitutional:      Appearance: Normal appearance. He is well-developed.  HENT:     Head: Normocephalic and atraumatic.  Eyes:     General: Lids are normal.     Conjunctiva/sclera: Conjunctivae normal.     Pupils: Pupils are equal, round, and reactive to light.  Cardiovascular:     Rate and Rhythm: Normal rate and regular rhythm.     Pulses: Normal pulses.          Radial pulses are 2+ on the right side and 2+ on the left side.     Heart sounds: Normal heart sounds. No murmur. No friction rub. No gallop.   Pulmonary:     Effort: Pulmonary effort is normal.     Breath sounds: Normal breath sounds.     Comments: Lungs clear to auscultation bilaterally.  Symmetric chest rise.  No wheezing, rales, rhonchi. Abdominal:     Palpations: Abdomen is soft. Abdomen is not rigid.     Tenderness: There is no abdominal tenderness. There is no guarding.  Musculoskeletal:        General: Normal range of motion.     Cervical back: Full passive range of motion without pain.     Comments: 1+ pitting edema noted right lower extremity that goes up to the proximal  tib-fib.  No overlying warmth, erythema.  No calf tenderness noted.  Bony tenderness.  No deformity or crepitus noted.  Skin:    General: Skin is warm and dry.     Capillary Refill: Capillary refill takes less than 2 seconds.     Comments: Good distal cap refill. RLE is not dusky in appearance or cool to touch.  Neurological:     Mental Status: He is alert and oriented to person, place, and time.  Psychiatric:        Speech: Speech normal.     ED Results / Procedures / Treatments   Labs (all labs ordered are listed, but only abnormal results are displayed) Labs Reviewed  BASIC METABOLIC PANEL - Abnormal; Notable for the following components:      Result Value   Glucose, Bld 106 (*)    All other components within normal limits  CBC WITH DIFFERENTIAL/PLATELET - Abnormal; Notable for the following components:   RBC 4.04 (*)    MCV 102.5 (*)    Platelets 113 (*)    All other components within normal limits    EKG None  Radiology VAS [place] LOWER EXTREMITY VENOUS (DVT) (ONLY [facility] & [facility] 7a-7p)  Result Date: 07/24/2019  Lower Venous DVTStudy Indications: Swelling X 2 weeks.  Risk Factors: DVT 2012 upper extremity. Comparison Study: No prior study on file for comparison Performing Technologist: Sharion Dove RVS  Examination Guidelines: A complete evaluation includes B-mode imaging, spectral Doppler, color Doppler, and power Doppler as needed of all accessible portions of each vessel. Bilateral testing is considered an integral part of a complete examination. Limited examinations for reoccurring indications may be performed as noted. The reflux portion of the exam is performed with the patient in reverse Trendelenburg.  +---------+---------------+---------+-----------+----------+--------------+ RIGHT    CompressibilityPhasicitySpontaneityPropertiesThrombus Aging +---------+---------------+---------+-----------+----------+--------------+ CFV      Full                                          pulsatile      +---------+---------------+---------+-----------+----------+--------------+  SFJ      Full                                                        +---------+---------------+---------+-----------+----------+--------------+ FV Prox  Full                                                        +---------+---------------+---------+-----------+----------+--------------+ FV Mid   Full                                                        +---------+---------------+---------+-----------+----------+--------------+ FV DistalFull                                                        +---------+---------------+---------+-----------+----------+--------------+ PFV      Full                                                        +---------+---------------+---------+-----------+----------+--------------+ POP      Full                                         pulsatile      +---------+---------------+---------+-----------+----------+--------------+ PTV      Full                                                        +---------+---------------+---------+-----------+----------+--------------+ PERO     Full                                                        +---------+---------------+---------+-----------+----------+--------------+   +----+---------------+---------+-----------+----------+--------------+ LEFTCompressibilityPhasicitySpontaneityPropertiesThrombus Aging +----+---------------+---------+-----------+----------+--------------+ CFV Full                                         pulsatile      +----+---------------+---------+-----------+----------+--------------+     Summary: RIGHT: - There is no evidence of deep vein thrombosis in the lower extremity.  Pulsatile waveforms and intertrial fluid noted  LEFT: Pulsatile waveforms noted.  *See table(s) above for measurements and observations.    Preliminary     Procedures Procedures  (including critical care time)  Medications Ordered in ED Medications -  No data to display  ED Course  I have reviewed the triage vital signs and the nursing notes.  Pertinent labs & imaging results that were available during my care of the patient were reviewed by me and considered in my medical decision making (see chart for details).    MDM Rules/Calculators/A&P                       74 year old male who presents for evaluation of 2 weeks of right lower extremity swelling.  No pain, no warmth, no history of trauma injury.  History of DVT in 2012 as well as PE.  Not currently on blood thinners.  On initially arrival, he is afebrile, nontoxic-appearing.  He does have swelling noted to the right lower extremity.  No overlying warmth, erythema.  He is neurovascular intact.  Concern for possible DVT given his history. Will plan for ultrasound.  History/physical exam not concerning for ischemic limb, septic arthritis, cellulitis.  Also consider chronic venous stasis.  BMP shows normal BUN and creatinine.  CBC without any significant leukocytosis.  DVT study negative for acute DVT.  At this time, unclear etiology of his swelling.  This could likely be chronic venous stasis changes.  At this time, he has good pulses, no concern for ischemic limb.  Discussed results with patient and daughter.  Instructed patient to follow-up with his primary care doctor.  Patient stable for outpatient follow-up with his primary care doctor. Discussed patient with Dr. Ronnald Nian who is agreeable. Patient had ample opportunity for questions and discussion. All patient's questions were answered with full understanding. Strict return precautions discussed. Patient expresses understanding and agreement to plan.   Portions of this note were generated with [employer]. Dictation errors may occur despite best attempts at proofreading.  Final Clinical Impression(s) / ED Diagnoses Final diagnoses:  Leg  swelling    Rx / DC Orders ED Discharge Orders    None       Volanda Napoleon, PA-C 07/24/19 2015    Lennice Sites, DO 07/24/19 2251

## 2019-07-24 NOTE — Progress Notes (Signed)
VASCULAR LAB PRELIMINARY  PRELIMINARY  PRELIMINARY  PRELIMINARY  Right lower extremity venous duplex completed.    Preliminary report:  See CV proc for preliminary results.  Gave Dr. Ronnald Nian report.  Astou Lada, RVT 07/24/2019, 7:52 PM

## 2019-07-24 NOTE — Discharge Instructions (Signed)
As we discussed, your ultrasound here did not show any evidence of blood clots in your legs.  Additionally, do not see any signs of infection and your lab work was reassuring.  As we discussed, this could be some chronic venous insufficiency that is contributing to your symptoms.  Continue wearing compression socks and elevating your legs.  Follow-up with your doctor.  Return the emergency department for any worsening pain in the leg, redness or swelling of the leg, discoloration of your foot, numbness/weakness, fever or any other worsening or concerning symptoms.

## 2019-07-24 NOTE — ED Triage Notes (Signed)
Pt reports for about 2 weeks had right leg swelling from foot up to groin that is just getting larger. Denies pain or injuries. Pt hx blood clots, not currently on blood thinners.

## 2019-07-29 ENCOUNTER — Other Ambulatory Visit: Payer: Self-pay | Admitting: Neurology

## 2019-08-24 ENCOUNTER — Telehealth: Payer: Self-pay | Admitting: Neurology

## 2019-08-24 NOTE — Telephone Encounter (Signed)
kathleen called requesting a CB to discuss pts medication refill advised pt would need an apt and they requested a CB due to their situation being difficult

## 2019-08-25 MED ORDER — RYTARY 23.75-95 MG PO CPCR: ORAL_CAPSULE | ORAL | 0 refills | Status: DC

## 2019-08-25 NOTE — Telephone Encounter (Signed)
Spoke with pt's wife. We discussed that Dr. Jaynee Eagles would really need to lay eyes on the pt since last year a telephone appt was completed because of [place]. 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 Her brother is the driver. She will talk to the brother about him staying with the mother while the pt and wife come into the office for appt. I assured her we could provide a another temporary refill of the Rytary. She was very appreciative and will call back asap to schedule. She is aware an NP can see the pt for this follow-up and we would be able to get him in soon. His memory is not as good as before but he is up and about each day. She verbalized appreciation for the call and wanted to make sure we told Dr. Bobby Rumpf for her.   Rytary refill sent to [facility].   When the pt's wife calls back, please schedule follow up with an NP and it is ok to schedule with Janett Billow.

## 2019-08-25 NOTE — Telephone Encounter (Signed)
Pt's wife Nunzio Cory called again stating she is needing to speak to provider to discuss pts RYTARY 23.75-95 MG CPCR and that it is a complicated issue. She states that she is not able to make an appt at this time but they are needing a refill on the medication or he will be out.

## 2019-09-13 NOTE — Telephone Encounter (Signed)
Pt's wife has called, she scheduled a f/u for pt on 06-23 with a 2:30 check in to see Megan,NP.  This is FYI no call back requested

## 2019-09-15 ENCOUNTER — Other Ambulatory Visit: Payer: Self-pay | Admitting: Neurology

## 2019-09-15 ENCOUNTER — Other Ambulatory Visit: Payer: Self-pay

## 2019-09-15 ENCOUNTER — Ambulatory Visit: Payer: Medicare HMO | Admitting: Adult Health

## 2019-09-15 ENCOUNTER — Encounter: Payer: Self-pay | Admitting: Adult Health

## 2019-09-15 VITALS — BP 138/78 | HR 83 | Ht 66.0 in | Wt 194.0 lb

## 2019-09-15 DIAGNOSIS — R413 Other amnesia: Secondary | ICD-10-CM

## 2019-09-15 DIAGNOSIS — G2581 Restless legs syndrome: Secondary | ICD-10-CM | POA: Diagnosis not present

## 2019-09-15 DIAGNOSIS — G20 Parkinson's disease: Secondary | ICD-10-CM | POA: Diagnosis not present

## 2019-09-15 NOTE — Patient Instructions (Signed)
Your Plan:  Continue rytary, Neupro, requip, artane Stay well hydrated If your symptoms worsen or you develop new symptoms please let us know.    Thank you for coming to see [place] at [facility]. I hope we have been able to provide you high quality care today.  You may receive a patient satisfaction survey over the next few weeks. We would appreciate your feedback and comments so that we may continue to improve ourselves and the health of our patients.

## 2019-09-15 NOTE — Progress Notes (Signed)
PATIENT: Luke Bennett DOB: 29-Mar-1945  REASON FOR VISIT: follow up HISTORY FROM: patient  HISTORY OF PRESENT ILLNESS: Today 09/15/19:  Luke Bennett is a 74 year old male with a history of Parkinson's disease.  He returns today for follow-up.  He was most recently started on Rytary.  Reports that he has tolerated that well.  Continues on Neupro, Requip and Artane.  Overall he feels that his symptoms have remained fairly stable.  Continues to have a mild tremor in the right hand.  He notices it more when he is ambulating.  Denies any significant changes with his gait or balance.  Denies any trouble swallowing.  His brother-in-law is with him today.  He notes that there has been some changes in his memory.  They find that he has become more forgetful.  Patient returns today for an evaluation.  HISTORY History Summary (for details on each appointment see individual visits below): Luke Bennett is a 74 y.o. male here as a referral from Dr. Doreene Nest for tremor. He has a PMHx of Schizoaffective disorder on Seroquel, depression, anxiety, TIA, MI, Crohn's Dz, thrombocytopenia, CKD, glucose intolerance.  He was originally seen in 2016 and at that time he reported that he was acting out his dreams, he had a resting tremor which is progressive.  He reported some memory loss, walking slower, tiring easily, tremor mostly with walking or resting less so with use.  No tremor when sleeping.  He denied any decline in smell or constipation or problems swallowing.  But he did endorse handwriting is getting poor, denied hypophonia or shuffling.  Patient had a history of dopamine blocking agents which can cause parkinsonism.  An MRI of the brain was ordered which was unremarkable.  A trial of Sinemet was given.  Also a DaTscan was ordered.  DaTscan was consistent with Parkinson's disease, he had been on Seroquel 300 mg with a long psychiatric history for which multiple medications have been tried and it was impossible for him to  stop or decrease the Seroquel.  He did not do well on the Sinemet and we changed to dopamine agonist.  He did very well on the Neupro patch which helped his resting tremor and that was slowly increased.  We tried both amantadine and Artane.  Patient already had a sleep study in the past as well.  He was also on ropinirole at night.  Medications tried: Sinemet (rash, had to stop), Mirapex, Neupro, Artane, amantadine, Requip.  REVIEW OF SYSTEMS: Out of a complete 14 system review of symptoms, the patient complains only of the following symptoms, and all other reviewed systems are negative.  See HPI  ALLERGIES: Allergies  Allergen Reactions  . Carbidopa W-Levodopa Rash  . Aspirin Other (See Comments)    HX Stomach bleeding   . Celecoxib Other (See Comments)    Stomach pain  . Bactrim [Sulfamethoxazole-Trimethoprim] Rash    Skin rash  . Sinemet [Carbidopa-Levodopa] Rash    HOME MEDICATIONS: Outpatient Medications Prior to Visit  Medication Sig Dispense Refill  . adalimumab (HUMIRA PEN) 40 MG/0.8ML injection Inject 40 mg into the skin every 14 (fourteen) days. On Monday's    . aspirin EC 81 MG tablet Take 81 mg by mouth daily.    . Carbidopa-Levodopa ER (RYTARY) 23.75-95 MG CPCR TAKE 1 CAPSULE THREE TIMES DAILY 90 capsule 0  . Cholecalciferol (VITAMIN D3) 400 units CAPS Take by mouth daily.    . Multiple Vitamin (MULTIVITAMIN WITH MINERALS) TABS tablet Take 1 tablet by mouth  daily.    . oxybutynin (DITROPAN) 5 MG tablet Take 5 mg by mouth.    . Prenatal Vit-Fe Fumarate-FA (MULTIVITAMIN-PRENATAL) 27-0.8 MG TABS tablet Take 2 tablets by mouth daily at 12 noon.    . QUEtiapine (SEROQUEL) 300 MG tablet Take 300 mg by mouth at bedtime.   4  . rOPINIRole (REQUIP) 0.5 MG tablet TAKE up to 3 TABLETS BY MOUTH AT BEDTIME as needed for Restless legs. 270 tablet 3  . rotigotine (NEUPRO) 4 MG/24HR Place 2 patches onto the skin daily. Removed old patch(es) with each new application. 49 patch 0  .  Rotigotine (NEUPRO) 8 MG/24HR PT24 Place 1 patch onto the skin daily. 30 patch 11  . trihexyphenidyl (ARTANE) 2 MG tablet Take 1 tablet (2 mg total) by mouth every evening. (Patient not taking: Reported on 08/26/2018) 30 tablet 11   No facility-administered medications prior to visit.    PAST MEDICAL HISTORY: Past Medical History:  Diagnosis Date  . Anxiety   . Arthritis   . BPH with urinary obstruction   . CKD (chronic kidney disease), stage III   . Coronary artery disease    cardiologist-  dr Atilano Median [facility] cardiology [facility]) per lov note dated 05/30/2016  pt hx MI approx. 2001 had cardiac cath in [place] native vessel disease no intervention   . Crohn's disease ([place])    followed by dr c. Cindee Salt (digestive specialist w/ [facility] in [place])-- dx approx. 2003 s/p  proctocolecotmy w/ ostomy and colectomy in summer 2012 for bowel obstruction;  treated w/ Humira injection's  . Depression   . Drug-induced cytopenia    hx previous treatment w/ 6MP  . Drug-induced pancytopenia (H[facility])    hx previous treatment w/ 6MP  . Eczema   . Foley catheter in place   . Gait disturbance    secondary to parkinson's  . GERD (gastroesophageal reflux disease)   . History of DVT (deep vein thrombosis)    post op surgery 2012-- upper extremity--  treated w/ coumadin 6 months  . History of MI (myocardial infarction) 2001  . History of pulmonary embolus (PE)    2003 and 2012-- post surgery --  treated w/ coumadin for 6 months  . History of transient ischemic attack (TIA)    2012--- no residual  . Ileostomy in place [facility])   . Iron deficiency anemia   . NAFL (nonalcoholic fatty liver)    followed by dr c. Cindee Salt (digestive specialist w/ [facility])  lov note in epic dated 11-20-2016,  stage 0 fibriosis  . Nephrolithiasis    bilateral nonobstructive stones for Renal US dated 04-05-2017  . Parkinson's disease [facility])    neurologist-  dr Bethena Midget ([facility])  . Pre-diabetes   . RLS (restless  legs syndrome)   . Schizoaffective disorder ([place])   . Thrombocytopenia ([place]) followed by pcp, dr briscoe   chronic -- secondary to 6-MP treatement's and Humira  . Tremor    secondary to parkinson's  . Urinary retention   . Wears glasses     PAST SURGICAL HISTORY: Past Surgical History:  Procedure Laterality Date  . ABDOMINOPERINEAL PROCTOCOLECTOMY  2003   W/  CREATION OSTOMY  . CARDIAC CATHETERIZATION  2001 approx. in [place]   native vessel disease (per dr Atilano Median note dated 05-30-2016 , cardiologist), in epic)  . COLECTOMY  summer 2012  . CYSTOSCOPY WITH INSERTION OF UROLIFT N/A 06/16/2017   Procedure: CYSTOSCOPY WITH INSERTION OF UROLIFT;  Surgeon: Cleon Gustin, MD;  Location:  [place];  Service: Urology;  Laterality: N/A;  . TONSILLECTOMY  child  . TRANSTHORACIC ECHOCARDIOGRAM  ECHO 03-27-2017   ef 48-54%, grade 1 diastolic dysfunction    FAMILY HISTORY: Family History  Problem Relation Age of Onset  . Cerebral aneurysm Father   . Hypertension Sister   . Hodgkin's lymphoma Sister   . Parkinsonism Neg Hx     SOCIAL HISTORY: Social History   Socioeconomic History  . Marital status: Married    Spouse name: Not on file  . Number of children: 4  . Years of education: Not on file  . Highest education level: Not on file  Occupational History  . Occupation: Retired  Tobacco Use  . Smoking status: Former Smoker    Years: 2.00    Types: Cigarettes    Quit date: 03/25/1969    Years since quitting: 50.5  . Smokeless tobacco: Never Used  Vaping Use  . Vaping Use: Never used  Substance and Sexual Activity  . Alcohol use: No    Alcohol/week: 0.0 standard drinks  . Drug use: No  . Sexual activity: Not on file  Other Topics Concern  . Not on file  Social History Narrative   Lives at home with wife and mother in-law, 3 people total.   Caffeine use: 1 cup Dr. Malachi Bonds "every 1.5 weeks" (as of 08/26/2018 hasn't had in awhile).    Right handed       Social Determinants of Health   Financial Resource Strain:   . Difficulty of Paying Living Expenses:   Food Insecurity:   . Worried About Charity fundraiser in the Last Year:   . Arboriculturist in the Last Year:   Transportation Needs:   . Film/video editor (Medical):   [name] Lack of Transportation (Non-Medical):   Physical Activity:   . Days of Exercise per Week:   . Minutes of Exercise per Session:   Stress:   . Feeling of Stress :   Social Connections:   . Frequency of Communication with Friends and Family:   . Frequency of Social Gatherings with Friends and Family:   . Attends Religious Services:   . Active Member of Clubs or Organizations:   . Attends Archivist Meetings:   [name] Marital Status:   Intimate Partner Violence:   . Fear of Current or Ex-Partner:   . Emotionally Abused:   [name] Physically Abused:   . Sexually Abused:       PHYSICAL EXAM  Vitals:   09/15/19 1454  BP: 138/78  Pulse: 83  Weight: 194 lb (88 kg)  Height: 5' 6"  (1.676 m)   Body mass index is 31.31 kg/m.  Generalized: Well developed, in no acute distress   Neurological examination  Mentation: Alert oriented to time, place, history taking. Follows all commands speech and language fluent Cranial nerve II-XII: Pupils were equal round reactive to light. Extraocular movements were full, visual field were full on confrontational test.  Head turning and shoulder shrug  were normal and symmetric. Motor: The motor testing reveals 5 over 5 strength of all 4 extremities. Good symmetric motor tone is noted throughout.  Sensory: Sensory testing is intact to soft touch on all 4 extremities. No evidence of extinction is noted.  Coordination: Cerebellar testing reveals good finger-nose-finger and heel-to-shin bilaterally.  Mild resting tremor on the right Gait and station: Patient stands with assistance.  Good stride.  Decreased arm swing on the right.  Good turns.  Reflexes: Deep tendon reflexes  are symmetric and normal bilaterally.   DIAGNOSTIC DATA (LABS, IMAGING, TESTING) - I reviewed patient records, labs, notes, testing and imaging myself where available.  Lab Results  Component Value Date   WBC 4.9 07/24/2019   HGB 13.6 07/24/2019   HCT 41.4 07/24/2019   MCV 102.5 (H) 07/24/2019   PLT 113 (L) 07/24/2019      Component Value Date/Time   NA 138 07/24/2019 1836   NA 137 02/11/2017 1515   K 4.1 07/24/2019 1836   CL 103 07/24/2019 1836   CO2 28 07/24/2019 1836   GLUCOSE 106 (H) 07/24/2019 1836   BUN 19 07/24/2019 1836   BUN 14 02/11/2017 1515   CREATININE 1.11 07/24/2019 1836   CALCIUM 9.0 07/24/2019 1836   PROT 7.1 02/11/2017 1515   ALBUMIN 4.0 02/11/2017 1515   AST 68 (H) 02/11/2017 1515   ALT 54 (H) 02/11/2017 1515   ALKPHOS 54 02/11/2017 1515   BILITOT 0.9 02/11/2017 1515   GFRNONAA >60 07/24/2019 1836   GFRAA >60 07/24/2019 1836   Lab Results  Component Value Date   VITAMINB12 546 09/28/2014   Lab Results  Component Value Date   TSH 1.240 09/28/2014      ASSESSMENT AND PLAN 74 y.o. year old male  has a past medical history of Anxiety, Arthritis, BPH with urinary obstruction, CKD (chronic kidney disease), stage III, Coronary artery disease, Crohn's disease ([place]), Depression, Drug-induced cytopenia, Drug-induced pancytopenia ([place]), Eczema, Foley catheter in place, Gait disturbance, GERD (gastroesophageal reflux disease), History of DVT (deep vein thrombosis), History of MI (myocardial infarction) (2001), History of pulmonary embolus (PE), History of transient ischemic attack (TIA), Ileostomy in place ([place]), Iron deficiency anemia, NAFL (nonalcoholic fatty liver), Nephrolithiasis, Parkinson's disease ([place]), Pre-diabetes, RLS (restless legs syndrome), Schizoaffective disorder ([place]), Thrombocytopenia ([place]) (followed by pcp, dr briscoe), Tremor, Urinary retention, and Wears glasses. here with:  Parkinson's disease   Continue Rytary, Neupro, and  Artane  Encouraged to stay well-hydrated  RLS   Continue Requip  Memory disturbance   MOCA 18/30- will retest in 3 months   Advised if symptoms worsen or he develops new symptoms he should let us know Follow-up in 3 months or sooner if needed   I spent 30 minutes of face-to-face and non-face-to-face time with patient.  This included previsit chart review, lab review, study review, order entry, electronic health record documentation, patient education.  Ward Givens, MSN, NP-C 09/15/2019, 2:51 PM [facility] [address] [place], [place] [address] [phone]

## 2019-09-19 ENCOUNTER — Other Ambulatory Visit: Payer: Self-pay | Admitting: Neurology

## 2019-09-19 DIAGNOSIS — G2581 Restless legs syndrome: Secondary | ICD-10-CM

## 2019-09-19 DIAGNOSIS — G20 Parkinson's disease: Secondary | ICD-10-CM

## 2019-09-19 DIAGNOSIS — G253 Myoclonus: Secondary | ICD-10-CM

## 2019-09-19 DIAGNOSIS — T50905A Adverse effect of unspecified drugs, medicaments and biological substances, initial encounter: Secondary | ICD-10-CM

## 2019-09-19 DIAGNOSIS — G2571 Drug induced akathisia: Secondary | ICD-10-CM

## 2019-12-22 ENCOUNTER — Other Ambulatory Visit: Payer: Self-pay | Admitting: Adult Health

## 2020-01-04 ENCOUNTER — Ambulatory Visit: Payer: Medicare HMO | Admitting: Adult Health

## 2020-01-13 ENCOUNTER — Other Ambulatory Visit: Payer: Self-pay

## 2020-01-13 ENCOUNTER — Encounter: Payer: Self-pay | Admitting: Adult Health

## 2020-01-13 ENCOUNTER — Ambulatory Visit: Payer: Medicare HMO | Admitting: Adult Health

## 2020-01-13 VITALS — BP 118/73 | HR 95 | Ht 66.0 in | Wt 196.0 lb

## 2020-01-13 DIAGNOSIS — G20 Parkinson's disease: Secondary | ICD-10-CM | POA: Diagnosis not present

## 2020-01-13 DIAGNOSIS — R413 Other amnesia: Secondary | ICD-10-CM

## 2020-01-13 NOTE — Progress Notes (Addendum)
PATIENT: Luke Bennett DOB: 28-Sep-1945  REASON FOR VISIT: follow up HISTORY FROM: patient  HISTORY OF PRESENT ILLNESS: Today 01/13/20:  Luke Bennett is a 74 year old male with a history of Parkinson's disease.  He returns today for follow-up for his memory.  At the last visit MoCA was 18 out of 30.  He reports that he continues to notice some changes with his memory and forgetfulness.  He does live at home with his wife.  Able to complete all ADLs independently.  He does not operate a motor vehicle.  His wife manages his medications appointments and the finances.  He does have input in the finances.  Reports that he is able to do household chores but sometimes needs reminding.  Also notices that he gets agitated easily.  In regards to Parkinson's disease he feels that he is doing well on Rytary, Requip, Neupro patch and Artane.  He returns today for an evaluation.  HISTORY  09/15/19:  Luke Bennett is a 73 year old male with a history of Parkinson's disease.  He returns today for follow-up.  He was most recently started on Rytary.  Reports that he has tolerated that well.  Continues on Neupro, Requip and Artane.  Overall he feels that his symptoms have remained fairly stable.  Continues to have a mild tremor in the right hand.  He notices it more when he is ambulating.  Denies any significant changes with his gait or balance.  Denies any trouble swallowing.  His brother-in-law is with him today.  He notes that there has been some changes in his memory.  They find that he has become more forgetful.  Patient returns today for an evaluation.  REVIEW OF SYSTEMS: Out of a complete 14 system review of symptoms, the patient complains only of the following symptoms, and all other reviewed systems are negative.  See HPI  ALLERGIES: Allergies  Allergen Reactions  . Carbidopa W-Levodopa Rash  . Aspirin Other (See Comments)    HX Stomach bleeding   . Celecoxib Other (See Comments)    Stomach pain  .  Bactrim [Sulfamethoxazole-Trimethoprim] Rash    Skin rash  . Sinemet [Carbidopa-Levodopa] Rash    HOME MEDICATIONS: Outpatient Medications Prior to Visit  Medication Sig Dispense Refill  . adalimumab (HUMIRA PEN) 40 MG/0.8ML injection Inject 40 mg into the skin every 14 (fourteen) days. On Monday's    . aspirin EC 81 MG tablet Take 81 mg by mouth daily.    . Cholecalciferol (VITAMIN D3) 400 units CAPS Take by mouth daily.    . Multiple Vitamin (MULTIVITAMIN WITH MINERALS) TABS tablet Take 1 tablet by mouth daily.    [name] oxybutynin (DITROPAN) 5 MG tablet Take 5 mg by mouth.    . Prenatal Vit-Fe Fumarate-FA (MULTIVITAMIN-PRENATAL) 27-0.8 MG TABS tablet Take 2 tablets by mouth daily at 12 noon.    . QUEtiapine (SEROQUEL) 300 MG tablet Take 300 mg by mouth at bedtime.   4  . rOPINIRole (REQUIP) 0.5 MG tablet TAKE UP TO 3 TABLETS AT BEDTIME AS NEEDED FOR RESTLESS LEGS 270 tablet 0  . Rotigotine (NEUPRO) 8 MG/24HR PT24 Place 1 patch onto the skin daily. 30 patch 11  . RYTARY 23.75-95 MG CPCR TAKE 1 CAPSULE THREE TIMES DAILY 270 capsule 3  . trihexyphenidyl (ARTANE) 2 MG tablet Take 1 tablet (2 mg total) by mouth every evening. 30 tablet 11   No facility-administered medications prior to visit.    PAST MEDICAL HISTORY: Past Medical History:  Diagnosis Date  .  Anxiety   . Arthritis   . BPH with urinary obstruction   . CKD (chronic kidney disease), stage III ([place])   . Coronary artery disease    cardiologist-  dr Atilano Median [facility] cardiology [facility]) per lov note dated 05/30/2016  pt hx MI approx. 2001 had cardiac cath in [place] native vessel disease no intervention   . Crohn's disease ([place])    followed by dr c. Cindee Salt (digestive specialist w/ [facility] in [place])-- dx approx. 2003 s/p  proctocolecotmy w/ ostomy and colectomy in summer 2012 for bowel obstruction;  treated w/ Humira injection's  . Depression   . Drug-induced cytopenia    hx previous treatment w/ 6MP  . Drug-induced  pancytopenia (HCC)    hx previous treatment w/ 6MP  . Eczema   . Foley catheter in place   . Gait disturbance    secondary to parkinson's  . GERD (gastroesophageal reflux disease)   . History of DVT (deep vein thrombosis)    post op surgery 2012-- upper extremity--  treated w/ coumadin 6 months  . History of MI (myocardial infarction) 2001  . History of pulmonary embolus (PE)    2003 and 2012-- post surgery --  treated w/ coumadin for 6 months  . History of transient ischemic attack (TIA)    2012--- no residual  . Ileostomy in place [facility])   . Iron deficiency anemia   . NAFL (nonalcoholic fatty liver)    followed by dr c. Cindee Salt (digestive specialist w/ [facility])  lov note in epic dated 11-20-2016,  stage 0 fibriosis  . Nephrolithiasis    bilateral nonobstructive stones for Renal US dated 04-05-2017  . Parkinson's disease [facility])    neurologist-  dr Bethena Midget ([facility])  . Pre-diabetes   . RLS (restless legs syndrome)   . Schizoaffective disorder ([place])   . Thrombocytopenia ([place]) followed by pcp, dr briscoe   chronic -- secondary to 6-MP treatement's and Humira  . Tremor    secondary to parkinson's  . Urinary retention   . Wears glasses     PAST SURGICAL HISTORY: Past Surgical History:  Procedure Laterality Date  . ABDOMINOPERINEAL PROCTOCOLECTOMY  2003   W/  CREATION OSTOMY  . CARDIAC CATHETERIZATION  2001 approx. in [place]   native vessel disease (per dr Atilano Median note dated 05-30-2016 , cardiologist), in epic)  . COLECTOMY  summer 2012  . CYSTOSCOPY WITH INSERTION OF UROLIFT N/A 06/16/2017   Procedure: CYSTOSCOPY WITH INSERTION OF UROLIFT;  Surgeon: Cleon Gustin, MD;  Location: [facility];  Service: Urology;  Laterality: N/A;  . TONSILLECTOMY  child  . TRANSTHORACIC ECHOCARDIOGRAM  ECHO 03-27-2017   ef 38-18%, grade 1 diastolic dysfunction    FAMILY HISTORY: Family History  Problem Relation Age of Onset  . Cerebral aneurysm Father    . Hypertension Sister   . Hodgkin's lymphoma Sister   . Parkinsonism Neg Hx     SOCIAL HISTORY: Social History   Socioeconomic History  . Marital status: Married    Spouse name: Not on file  . Number of children: 4  . Years of education: Not on file  . Highest education level: Not on file  Occupational History  . Occupation: Retired  Tobacco Use  . Smoking status: Former Smoker    Years: 2.00    Types: Cigarettes    Quit date: 03/25/1969    Years since quitting: 50.8  . Smokeless tobacco: Never Used  Vaping Use  . Vaping Use: Never used  Substance and Sexual Activity  . Alcohol use: No    Alcohol/week: 0.0 standard drinks  . Drug use: No  . Sexual activity: Not on file  Other Topics Concern  . Not on file  Social History Narrative   Lives at home with wife and mother in-law, 3 people total.   Caffeine use: 1 cup Dr. Malachi Bonds "every 1.5 weeks" (as of 08/26/2018 hasn't had in awhile).    Right handed      Social Determinants of Health   Financial Resource Strain:   . Difficulty of Paying Living Expenses: Not on file  Food Insecurity:   . Worried About Charity fundraiser in the Last Year: Not on file  . Ran Out of Food in the Last Year: Not on file  Transportation Needs:   . Lack of Transportation (Medical): Not on file  . Lack of Transportation (Non-Medical): Not on file  Physical Activity:   . Days of Exercise per Week: Not on file  . Minutes of Exercise per Session: Not on file  Stress:   . Feeling of Stress : Not on file  Social Connections:   . Frequency of Communication with Friends and Family: Not on file  . Frequency of Social Gatherings with Friends and Family: Not on file  . Attends Religious Services: Not on file  . Active Member of Clubs or Organizations: Not on file  . Attends Archivist Meetings: Not on file  . Marital Status: Not on file  Intimate Partner Violence:   . Fear of Current or Ex-Partner: Not on file  . Emotionally Abused:  Not on file  . Physically Abused: Not on file  . Sexually Abused: Not on file      PHYSICAL EXAM  Vitals:   01/13/20 0844  BP: 118/73  Pulse: 95  Weight: 196 lb (88.9 kg)  Height: 5' 6"  (1.676 m)   Body mass index is 31.64 kg/m.   Montreal Cognitive Assessment  01/13/2020 09/15/2019  Visuospatial/ Executive (0/5) 4 3  Naming (0/3) 3 3  Attention: Read list of digits (0/2) 2 2  Attention: Read list of letters (0/1) 1 1  Attention: Serial 7 subtraction starting at 100 (0/3) 3 1  Language: Repeat phrase (0/2) 1 1  Language : Fluency (0/1) 1 0  Abstraction (0/2) 0 2  Delayed Recall (0/5) 5 0  Orientation (0/6) 6 5  Total 26 18     Generalized: Well developed, in no acute distress   Neurological examination  Mentation: Alert oriented to time, place, history taking. Follows all commands speech and language fluent Cranial nerve II-XII: Pupils were equal round reactive to light. Extraocular movements were full, visual field were full on confrontational test.  Head turning and shoulder shrug  were normal and symmetric. Motor: The motor testing reveals 5 over 5 strength of all 4 extremities. Good symmetric motor tone is noted throughout.  Sensory: Sensory testing is intact to soft touch on all 4 extremities. No evidence of extinction is noted.  Coordination: Cerebellar testing reveals good finger-nose-finger and heel-to-shin bilaterally.  Gait and station: Good stride.  Decreased arm swing.  Tandem gait not attempted. Reflexes: Deep tendon reflexes are symmetric and normal bilaterally.   DIAGNOSTIC DATA (LABS, IMAGING, TESTING) - I reviewed patient records, labs, notes, testing and imaging myself where available.  Lab Results  Component Value Date   WBC 4.9 07/24/2019   HGB 13.6 07/24/2019   HCT 41.4 07/24/2019   MCV 102.5 (H) 07/24/2019  PLT 113 (L) 07/24/2019      Component Value Date/Time   NA 138 07/24/2019 1836   NA 137 02/11/2017 1515   K 4.1 07/24/2019 1836    CL 103 07/24/2019 1836   CO2 28 07/24/2019 1836   GLUCOSE 106 (H) 07/24/2019 1836   BUN 19 07/24/2019 1836   BUN 14 02/11/2017 1515   CREATININE 1.11 07/24/2019 1836   CALCIUM 9.0 07/24/2019 1836   PROT 7.1 02/11/2017 1515   ALBUMIN 4.0 02/11/2017 1515   AST 68 (H) 02/11/2017 1515   ALT 54 (H) 02/11/2017 1515   ALKPHOS 54 02/11/2017 1515   BILITOT 0.9 02/11/2017 1515   GFRNONAA >60 07/24/2019 1836   GFRAA >60 07/24/2019 1836    Lab Results  Component Value Date   VITAMINB12 546 09/28/2014   Lab Results  Component Value Date   TSH 1.240 09/28/2014      ASSESSMENT AND PLAN 74 y.o. year old male  has a past medical history of Anxiety, Arthritis, BPH with urinary obstruction, CKD (chronic kidney disease), stage III ([place]), Coronary artery disease, Crohn's disease ([place]), Depression, Drug-induced cytopenia, Drug-induced pancytopenia ([place]), Eczema, Foley catheter in place, Gait disturbance, GERD (gastroesophageal reflux disease), History of DVT (deep vein thrombosis), History of MI (myocardial infarction) (2001), History of pulmonary embolus (PE), History of transient ischemic attack (TIA), Ileostomy in place ([place]), Iron deficiency anemia, NAFL (nonalcoholic fatty liver), Nephrolithiasis, Parkinson's disease ([place]), Pre-diabetes, RLS (restless legs syndrome), Schizoaffective disorder ([place]), Thrombocytopenia ([place]) (followed by pcp, dr briscoe), Tremor, Urinary retention, and Wears glasses. here with :  1.  Memory disturbance  - MOCA improved to 26/30 -We will continue monitoring memory for now  2.  Parkinson's disease  -Continue Artane, Rytary, requip and Neupro patch  Advised if symptoms worsen or he develops new symptoms they should let us know  I spent 30 minutes of face-to-face and non-face-to-face time with patient.  This included previsit chart review, lab review, study review, order entry, electronic health record documentation, patient education.  Ward Givens, MSN, NP-C  01/13/2020, 8:51 AM [facility] [address], [place], [place] [address] [phone]  Made any corrections needed, and agree with history, physical, neuro exam,assessment and plan as stated.     Sarina Ill, MD [facility]

## 2020-04-06 ENCOUNTER — Telehealth: Payer: Self-pay | Admitting: Adult Health

## 2020-04-06 NOTE — Telephone Encounter (Signed)
Wife(on DPR) states a while ago Dr Jaynee Eagles informed them if ever they needed to speak with her she would call them.  Wife states since pt is now concerned about seeing double he would very much like to speak directly with Dr Jaynee Eagles.  Please call pt at [phone]

## 2020-04-06 NOTE — Telephone Encounter (Signed)
I called the pt's wife and spoke with her. She said she had already been made aware of the message so she must have called back in the meantime, but I was not aware. The wife was appreciative of the information however she stated she does not feel the pt is having a stroke. She thinks its his Parkinson's. He first noticed the double vision about a week ago. It comes and goes and it is mainly noticed when he is watching TV. She states he has had mini strokes before and they did not look like this. I did educate her that the patient may not present the same way each time he has a TIA or stroke depending on which part of the brain is affected. She verbalized understanding. She said they are "not gonna do that right now" referencing our recommendation to go to the ER. She stated they have been staying in due to the Omicron variant and do not want to go anywhere. She asked if there were medications to help with double vision related to parkinson's. I advised a message would be sent to Dr Jaynee Eagles. She was very appreciative of the call and assured me that if she felt like he was having a stroke or that something wasn't right then they would get him to the ER.

## 2020-04-06 NOTE — Telephone Encounter (Signed)
Dr Jaynee Eagles is unable to call patient at this time and she has advised that he needs to go to the ER for evaluation of his double vision as this could be possible stroke symptoms. I have called the wife but did not receive an answer. The greeting stated wireless customer was not available and to please try again. I will try again soon.

## 2020-04-11 NOTE — Telephone Encounter (Signed)
He sees double sometimes. It goes away. Side by side but up a little. Changing glasses will help. It can last 3-6 seconds. It can be due to PD but would need to rule out other causes such as stroke, myasthenia gravis or others to say that it is due to his parkinson's. Moving glasses makes it better, may be his glasses. Otherwise no headaches. No other symptoms. Just happens with the TV, doesn't happen away from the TV. They decine any workup at this time. We can monitor it but if it worsens or he develops other symptoms they need to call me. Also try closing each eye when it happens and see if the diplopia goes away. Romelle Starcher fyi)   Tori: Can you send them instructions on how to log into mychart so they can send me emails please? thanks

## 2020-04-26 ENCOUNTER — Other Ambulatory Visit: Payer: Self-pay | Admitting: Neurology

## 2020-04-26 DIAGNOSIS — G20 Parkinson's disease: Secondary | ICD-10-CM

## 2020-05-19 ENCOUNTER — Encounter: Payer: Self-pay | Admitting: Adult Health

## 2020-07-17 ENCOUNTER — Ambulatory Visit: Payer: Medicare HMO | Admitting: Adult Health

## 2020-07-18 ENCOUNTER — Telehealth: Payer: Self-pay | Admitting: Adult Health

## 2020-07-18 NOTE — Telephone Encounter (Signed)
Noted  

## 2020-07-18 NOTE — Telephone Encounter (Signed)
FYI   PAP form application form MY Rytary I will Give to Nurse for [place] .  Thanks Louie Boston I put form on your desk thank you .

## 2020-07-19 NOTE — Telephone Encounter (Signed)
My rytary enrollment completed for pt.  To MM/NP for review and signature.

## 2020-10-02 ENCOUNTER — Telehealth: Payer: Self-pay | Admitting: Adult Health

## 2020-10-02 DIAGNOSIS — G2581 Restless legs syndrome: Secondary | ICD-10-CM

## 2020-10-02 DIAGNOSIS — G253 Myoclonus: Secondary | ICD-10-CM

## 2020-10-02 DIAGNOSIS — G20 Parkinson's disease: Secondary | ICD-10-CM

## 2020-10-02 DIAGNOSIS — G2571 Drug induced akathisia: Secondary | ICD-10-CM

## 2020-10-02 NOTE — Telephone Encounter (Signed)
Pt request refill rOPINIRole (REQUIP) 0.5 MG tablet at [facility])   Also would like a call back from Dr. Jaynee Eagles to discuss moving to [place].

## 2020-10-03 MED ORDER — ROPINIROLE HCL 0.5 MG PO TABS
ORAL_TABLET | ORAL | 0 refills | Status: AC
Start: 2020-10-03 — End: ?

## 2020-10-03 NOTE — Telephone Encounter (Signed)
I called and will refil the requip to [facility] for them 90 day supply.  Are moving to [place] and wanted to say good bye.  (She asked for a call back from you).

## 2020-10-03 NOTE — Addendum Note (Signed)
Addended by: Brandon Melnick on: 10/03/2020 02:07 PM   Modules accepted: Orders

## 2020-10-05 ENCOUNTER — Telehealth: Payer: Self-pay | Admitting: Adult Health

## 2020-10-05 NOTE — Telephone Encounter (Signed)
Pt's wife has called to cx the appointment because they are moving to [facility] before the appointment date.  Wife(on DPR) is asking for a call from Dr Jaynee Eagles for an opportunity to say good bye.

## 2020-11-13 ENCOUNTER — Ambulatory Visit: Payer: Medicare HMO | Admitting: Neurology

## 2021-06-06 ENCOUNTER — Other Ambulatory Visit: Payer: Self-pay | Admitting: Adult Health

## 2021-06-06 DIAGNOSIS — G20 Parkinson's disease: Secondary | ICD-10-CM

## 2021-06-06 NOTE — Telephone Encounter (Signed)
Per the chart, this patient has moved or will be moving to [facility]. Patient hasn't been seen since October 2021. Pt will need a new neurologist. I tried to call the wife and it rang several times but then I received a message saying the call could not be completed. Will try again later. Could potentially provide one month of refill but need to speak with pt's wife. Looks like they had been getting this through the patient assistance program. ?
